# Patient Record
Sex: Male | Born: 1975 | Race: Black or African American | Hispanic: No | Marital: Single | Smoking: Current every day smoker
Health system: Southern US, Community
[De-identification: ages and names within clinical notes are randomized; demographics above are authoritative.]

## PROBLEM LIST (undated history)

## (undated) DIAGNOSIS — M419 Scoliosis, unspecified: Secondary | ICD-10-CM

## (undated) DIAGNOSIS — J45909 Unspecified asthma, uncomplicated: Secondary | ICD-10-CM

## (undated) DIAGNOSIS — G43909 Migraine, unspecified, not intractable, without status migrainosus: Secondary | ICD-10-CM

## (undated) DIAGNOSIS — J302 Other seasonal allergic rhinitis: Secondary | ICD-10-CM

## (undated) DIAGNOSIS — M4800 Spinal stenosis, site unspecified: Secondary | ICD-10-CM

## (undated) HISTORY — PX: COSMETIC SURGERY: SHX468

---

## 2008-10-22 ENCOUNTER — Emergency Department (HOSPITAL_COMMUNITY): Admission: EM | Admit: 2008-10-22 | Discharge: 2008-10-22 | Payer: Self-pay | Admitting: Family Medicine

## 2009-08-02 ENCOUNTER — Emergency Department (HOSPITAL_COMMUNITY): Admission: EM | Admit: 2009-08-02 | Discharge: 2009-08-02 | Payer: Self-pay | Admitting: Family Medicine

## 2013-05-04 ENCOUNTER — Emergency Department (HOSPITAL_COMMUNITY)
Admission: EM | Admit: 2013-05-04 | Discharge: 2013-05-04 | Disposition: A | Discharge status: Home / Self Care | Payer: Self-pay | Attending: Emergency Medicine | Admitting: Emergency Medicine

## 2013-05-04 ENCOUNTER — Encounter (HOSPITAL_COMMUNITY): Payer: Self-pay | Admitting: *Deleted

## 2013-05-04 DIAGNOSIS — J45909 Unspecified asthma, uncomplicated: Secondary | ICD-10-CM | POA: Insufficient documentation

## 2013-05-04 DIAGNOSIS — Z8709 Personal history of other diseases of the respiratory system: Secondary | ICD-10-CM | POA: Insufficient documentation

## 2013-05-04 DIAGNOSIS — Z79899 Other long term (current) drug therapy: Secondary | ICD-10-CM | POA: Insufficient documentation

## 2013-05-04 DIAGNOSIS — R519 Headache, unspecified: Secondary | ICD-10-CM

## 2013-05-04 DIAGNOSIS — H53149 Visual discomfort, unspecified: Secondary | ICD-10-CM | POA: Insufficient documentation

## 2013-05-04 DIAGNOSIS — R110 Nausea: Secondary | ICD-10-CM | POA: Insufficient documentation

## 2013-05-04 DIAGNOSIS — IMO0001 Reserved for inherently not codable concepts without codable children: Secondary | ICD-10-CM | POA: Insufficient documentation

## 2013-05-04 DIAGNOSIS — M549 Dorsalgia, unspecified: Secondary | ICD-10-CM | POA: Insufficient documentation

## 2013-05-04 DIAGNOSIS — R51 Headache: Secondary | ICD-10-CM | POA: Insufficient documentation

## 2013-05-04 DIAGNOSIS — F17200 Nicotine dependence, unspecified, uncomplicated: Secondary | ICD-10-CM | POA: Insufficient documentation

## 2013-05-04 DIAGNOSIS — Z87798 Personal history of other (corrected) congenital malformations: Secondary | ICD-10-CM | POA: Insufficient documentation

## 2013-05-04 HISTORY — DX: Spinal stenosis, site unspecified: M48.00

## 2013-05-04 HISTORY — DX: Scoliosis, unspecified: M41.9

## 2013-05-04 HISTORY — DX: Migraine, unspecified, not intractable, without status migrainosus: G43.909

## 2013-05-04 HISTORY — DX: Other seasonal allergic rhinitis: J30.2

## 2013-05-04 HISTORY — DX: Unspecified asthma, uncomplicated: J45.909

## 2013-05-04 LAB — BASIC METABOLIC PANEL
BUN: 12 mg/dL (ref 6–23)
GFR calc Af Amer: >90 mL/min (ref >90)
GFR calc non Af Amer: >90 mL/min (ref >90)
Potassium: 3.4 mEq/L — ABNORMAL LOW (ref 3.5–5.1)
Sodium: 138 mEq/L (ref 135–145)

## 2013-05-04 LAB — CBC WITH DIFFERENTIAL/PLATELET
Basophils Absolute: 0 10*3/uL (ref 0.0–0.1)
Basophils Relative: 0 % (ref 0–1)
Eosinophils Absolute: 0.1 10*3/uL (ref 0.0–0.7)
MCH: 29.7 pg (ref 26.0–34.0)
MCHC: 35.6 g/dL (ref 30.0–36.0)
Monocytes Relative: 8 % (ref 3–12)
Neutro Abs: 6.2 10*3/uL (ref 1.7–7.7)
Neutrophils Relative %: 68 % (ref 43–77)
Platelets: 203 10*3/uL (ref 150–400)
RBC: 4.88 MIL/uL (ref 4.22–5.81)
RDW: 13.4 % (ref 11.5–15.5)
WBC: 9.2 10*3/uL (ref 4.0–10.5)

## 2013-05-04 LAB — CG4 I-STAT (LACTIC ACID): Lactic Acid, Venous: 0.68 mmol/L (ref 0.5–2.2)

## 2013-05-04 MED ORDER — METOCLOPRAMIDE HCL 5 MG/ML IJ SOLN
10.0000 mg | Freq: Once | INTRAMUSCULAR | Status: AC
  Administered 2013-05-04: 10 mg via INTRAMUSCULAR
  Filled 2013-05-04: qty 2

## 2013-05-04 MED ORDER — HYDROMORPHONE HCL PF 1 MG/ML IJ SOLN: 1.0000 mg | Freq: Once | INTRAMUSCULAR | Status: DC

## 2013-05-04 MED ORDER — DIPHENHYDRAMINE HCL 50 MG/ML IJ SOLN
25.0000 mg | Freq: Once | INTRAMUSCULAR | Status: AC
  Administered 2013-05-04: 25 mg via INTRAMUSCULAR
  Filled 2013-05-04: qty 1

## 2013-05-04 MED ORDER — KETOROLAC TROMETHAMINE 30 MG/ML IJ SOLN
30.0000 mg | Freq: Once | INTRAMUSCULAR | Status: AC
  Administered 2013-05-04: 30 mg via INTRAMUSCULAR
  Filled 2013-05-04: qty 1

## 2013-05-04 MED ORDER — DIPHENHYDRAMINE HCL 50 MG/ML IJ SOLN
25.0000 mg | Freq: Once | INTRAMUSCULAR | Status: AC
  Administered 2013-05-04: 25 mg via INTRAVENOUS
  Filled 2013-05-04: qty 1

## 2013-05-04 MED ORDER — SODIUM CHLORIDE 0.9 % IV BOLUS (SEPSIS)
1000.0000 mL | Freq: Once | INTRAVENOUS | Status: AC
  Administered 2013-05-04: 1000 mL via INTRAVENOUS

## 2013-05-04 MED ORDER — DIPHENHYDRAMINE HCL 50 MG/ML IJ SOLN: 25.0000 mg | Freq: Once | INTRAMUSCULAR | Status: DC

## 2013-05-04 MED ORDER — HYDROMORPHONE HCL PF 1 MG/ML IJ SOLN
1.0000 mg | Freq: Once | INTRAMUSCULAR | Status: AC
  Administered 2013-05-04: 1 mg via INTRAMUSCULAR
  Filled 2013-05-04: qty 1

## 2013-05-04 NOTE — ED Notes (Signed)
Per EMS report: pt from home: pt c/o back pain secondary spinal stenosis.  Pt also c/o migraine that began 2 days.  Pt has chronic neck and back pain but pain got worse about 30 minutes prior to arrival to hospital. EMS VS: BP: 142/P, HR: 100R, RR: 20, 96% RA.  Rates pain 10/10 for both back and migraine.

## 2013-05-04 NOTE — ED Provider Notes (Signed)
History     CSN: [id number]  Arrival date & time 05/04/13  0021   First MD Initiated Contact with Patient 05/04/13 0116      Chief Complaint  Patient presents with  . Back Pain  . Migraine    (Consider location/radiation/quality/duration/timing/severity/associated sxs/prior treatment) HPI Comments: Right sided, intermittent, throbbing headache x3 days without thunderclap onset. No aggravating or alleviating factors. Patient took ibuprofen with only very mild relief. Patient with associated phonophobia, photophobia, and nausea without emesis. Patient denies fevers, vision changes, difficulty speaking or swallowing, extremity weakness and an inability to ambulate. Hx of migraines but states hasn't had one in a while because "chiropractor did something that helped" him many years ago.  Patient is a 37 y.o. male presenting with migraines. The history is provided by the patient. No language interpreter was used.  Migraine This is a new problem. The current episode started in the past 7 days. The problem occurs intermittently. Progression since onset: gradually worsened followed by slight improvement and reworsening. Associated symptoms include headaches, myalgias (neck and back) and nausea. Pertinent negatives include no abdominal pain, chest pain, fever, numbness, vomiting or weakness. Associated symptoms comments: Photophobia, phonophobia. Exacerbated by: light and certain movements. He has tried NSAIDs for the symptoms. The treatment provided mild relief.    Past Medical History  Diagnosis Date  . Asthma   . Migraine   . Spinal stenosis   . Scoliosis   . Seasonal allergies     Past Surgical History  Procedure Laterality Date  . Cosmetic surgery      No family history on file.  History  Substance Use Topics  . Smoking status: Current Every Day Smoker -- 1.00 packs/day  . Smokeless tobacco: Not on file  . Alcohol Use: Yes     Comment: occsional     Review of Systems   Constitutional: Negative for fever.  HENT: Negative for trouble swallowing.   Eyes: Positive for photophobia. Negative for visual disturbance.  Respiratory: Negative for shortness of breath.   Cardiovascular: Negative for chest pain.  Gastrointestinal: Positive for nausea. Negative for vomiting and abdominal pain.  Musculoskeletal: Positive for myalgias (neck and back) and back pain.  Neurological: Positive for headaches. Negative for syncope, speech difficulty, weakness and numbness.  All other systems reviewed and are negative.    Allergies  Sulfa antibiotics  Home Medications   Current Outpatient Rx  Name  Route  Sig  Dispense  Refill  . acetaminophen (TYLENOL) 500 MG tablet   Oral   Take 500-1,000 mg by mouth every 6 (six) hours as needed for pain.         Marland Kitchen ibuprofen (ADVIL,MOTRIN) 200 MG tablet   Oral   Take 600 mg by mouth every 8 (eight) hours as needed for pain.           BP 106/61  Pulse 90  Temp(Src) 99.8 F (37.7 C) (Oral)  Resp 18  Ht 5\' 11"  (1.803 m)  Wt 200 lb (90.719 kg)  BMI 27.91 kg/m2  SpO2 100%  Physical Exam  Nursing note and vitals reviewed. Constitutional: He is oriented to person, place, and time. He appears well-developed and well-nourished. No distress.  HENT:  Head: Normocephalic and atraumatic.  Right Ear: External ear normal.  Left Ear: External ear normal.  Mouth/Throat: Oropharynx is clear and moist. No oropharyngeal exudate.  Eyes: Conjunctivae and EOM are normal. Pupils are equal, round, and reactive to light. No scleral icterus.  Neck: Normal range of motion.  Neck supple.  Full ROM of neck with point tenderness of R paraspinal muscles. Negative Brudzinski's sign. No nuchal rigidity or other meningeal signs.  Cardiovascular: Normal rate, regular rhythm, normal heart sounds and intact distal pulses.   Pulmonary/Chest: Effort normal and breath sounds normal. No respiratory distress. He has no wheezes. He has no rales.   Abdominal: Soft. He exhibits no distension. There is no tenderness. There is no rebound and no guarding.  Musculoskeletal: Normal range of motion. He exhibits no edema.  TTP of upper thoracic midline and paraspinal muscles. No palpable deformities or step offs appreciated.  Lymphadenopathy:    He has no cervical adenopathy.  Neurological: He is alert and oriented to person, place, and time. He has normal reflexes. He displays normal reflexes. No cranial nerve deficit. He exhibits normal muscle tone. Coordination normal.  Cranial nerves III through XII grossly intact. Patient exhibits equal grip strength bilaterally with 5 out of 5 strength against resistance in his upper and lower extremities. DTRs normal and symmetric. Patient moves his extremities without ataxia. No sensory or motor deficits appreciated. Patient speaks in full goal oriented sentences.  Skin: Skin is warm and dry. No rash noted. He is not diaphoretic. No erythema.  Psychiatric: He has a normal mood and affect. His behavior is normal.    ED Course  Procedures (including critical care time)  Labs Reviewed  BASIC METABOLIC PANEL - Abnormal; Notable for the following:    Potassium 3.4 (*)    Glucose, Bld 101 (*)    All other components within normal limits  CBC WITH DIFFERENTIAL  CG4 I-STAT (LACTIC ACID)   No results found.   1. Headache     MDM  Patient with hx of migraines, scoliosis and spinal stenosis presents for intermittent R sided headache that is throbbing in nature without thunderclap onset x 3 days. Physical exam without significant findings and patient neurovascularly intact. Do not believe further work up with imaging is necessary given lack of focal neurologic deficits and +hx of migraines. Low suspicion for meningitis given lack of fever and nuchal rigidity and negative Brudzinski's sign on physical exam; do not believe LP currently warranted. Patient also endorses chronic nature of back and neck pain. Will  tx with IM toradol, reglan, and benadryl and reassess. Patient seen also by Dr. Norlene Campbell who is in agreement with this work up and management plan.  Patient denies improvement in symptoms with migraine cocktail. Continues to be well and nontoxic appearing. 1mg  IM dilaudid ordered. Given temp trending upward, will obtain CBC, BMP, and lactic acid for further evaluation of symptoms.  Labs without leukocytosis, anemia, or electrolyte imbalance. Kidney function preserved and lactic acid level within normal limits. Wife endorses improvement in patient's symptoms with Dilaudid. Patient sleeping soundly and snoring, in NAD or discomfort in hospital bed. Patient appropriate for d/c with PCP follow up. Ibuprofen recommended for symptoms as well as adequate rest and fluid hydration. Neurology referral given if symptoms persist. Spouse verbalizes comfort and understanding with plan with no unaddressed concerns.   Antony Madura, PA-C 05/04/13 [phone]

## 2013-05-04 NOTE — ED Provider Notes (Signed)
Medical screening examination/treatment/procedure(s) were conducted as a shared visit with non-physician practitioner(s) and myself.  I personally evaluated the patient during the encounter.  Pt with headache since Thursday.  Chills and feeling hot, but no fever.  HA right sided, +photo/phono phobia.  H/o migranes, also h/o meningitis, most likely viral several years ago.  No sick contacts, no rash, no confusion.  Exam shows normal neuro status, CNs tested and intact.  TTP over right occiput and paraspinal on right.  Normal rotational movement, normal extension, pain with flexion over paraspinal muscles.  D/w pt and wife regarding meningitis.  Suspect this is migraine, but tx of viral meningitis supportive as well.  Will get pain under control.  Given precautions for return.  Olivia Mackie, MD 05/04/13 [phone]

## 2013-05-04 NOTE — ED Notes (Signed)
CG4 I-stat Lactic acid result given to Dr. Norlene Campbell

## 2013-05-04 NOTE — ED Notes (Addendum)
Pt reports having chills and is warm to the touch. Pt's significant other concern about pt having possible meningitis d/t pt having similar sx as the last time pt had meningitis. Antony Madura, PA made aware.

## 2013-09-18 ENCOUNTER — Emergency Department (INDEPENDENT_AMBULATORY_CARE_PROVIDER_SITE_OTHER)
Admission: EM | Admit: 2013-09-18 | Discharge: 2013-09-18 | Disposition: A | Discharge status: Home / Self Care | Payer: Self-pay | Source: Home / Self Care | Attending: Family Medicine | Admitting: Family Medicine

## 2013-09-18 ENCOUNTER — Encounter (HOSPITAL_COMMUNITY): Payer: Self-pay | Admitting: Emergency Medicine

## 2013-09-18 DIAGNOSIS — K219 Gastro-esophageal reflux disease without esophagitis: Secondary | ICD-10-CM

## 2013-09-18 MED ORDER — GI COCKTAIL ~~LOC~~
30.0000 mL | Freq: Once | ORAL | Status: AC
  Administered 2013-09-18: 30 mL via ORAL

## 2013-09-18 MED ORDER — GI COCKTAIL ~~LOC~~
ORAL | Status: AC
  Filled 2013-09-18: qty 30

## 2013-09-18 MED ORDER — PANTOPRAZOLE SODIUM 40 MG PO TBEC: 40.0000 mg | DELAYED_RELEASE_TABLET | Freq: Every day | ORAL | Status: DC

## 2013-09-18 NOTE — ED Provider Notes (Signed)
CSN: [id number]     Arrival date & time 09/18/13  1438 History   First MD Initiated Contact with Patient 09/18/13 1626     Chief Complaint  Patient presents with  . Abdominal Pain  . Cyst   (Consider location/radiation/quality/duration/timing/severity/associated sxs/prior Treatment) Patient is a 37 y.o. male presenting with abdominal pain. The history is provided by the patient.  Abdominal Pain This is a new problem. The current episode started more than 1 week ago (h/o gerd, feels like stomach in knot.). The problem has been gradually worsening. Associated symptoms include abdominal pain.    Past Medical History  Diagnosis Date  . Asthma   . Migraine   . Spinal stenosis   . Scoliosis   . Seasonal allergies    Past Surgical History  Procedure Laterality Date  . Cosmetic surgery     History reviewed. No pertinent family history. History  Substance Use Topics  . Smoking status: Current Every Day Smoker -- 1.00 packs/day  . Smokeless tobacco: Not on file  . Alcohol Use: Yes     Comment: occsional    Review of Systems  Constitutional: Negative.   HENT: Negative.   Respiratory: Negative.   Cardiovascular: Negative.   Gastrointestinal: Positive for abdominal pain. Negative for nausea, vomiting and diarrhea.  Skin: Negative.     Allergies  Sulfa antibiotics  Home Medications   Current Outpatient Rx  Name  Route  Sig  Dispense  Refill  . acetaminophen (TYLENOL) 500 MG tablet   Oral   Take 500-1,000 mg by mouth every 6 (six) hours as needed for pain.         Marland Kitchen ibuprofen (ADVIL,MOTRIN) 200 MG tablet   Oral   Take 600 mg by mouth every 8 (eight) hours as needed for pain.         . pantoprazole (PROTONIX) 40 MG tablet   Oral   Take 1 tablet (40 mg total) by mouth daily.   30 tablet   1    BP 130/84  Pulse 69  Temp(Src) 98.1 F (36.7 C) (Oral)  Resp 18  SpO2 99% Physical Exam  Nursing note and vitals reviewed. Constitutional: He is oriented to person,  place, and time. He appears well-developed and well-nourished.  Pulmonary/Chest: Effort normal and breath sounds normal.  Abdominal: Soft. Bowel sounds are normal. He exhibits no distension and no mass. There is tenderness in the epigastric area. There is no rigidity, no rebound and no guarding.    Neurological: He is alert and oriented to person, place, and time.  Skin: Skin is warm and dry.    ED Course  Procedures (including critical care time) Labs Review Labs Reviewed - No data to display Imaging Review No results found.    MDM      Linna Hoff, MD 09/20/13 [phone]

## 2013-09-18 NOTE — ED Notes (Signed)
C/o cyst on head since June.  Patient states he went to the ER and was given keflex.  Followed directions and finished the keflex but the cyst has not went away.  Patient states that the cyst has popped on its own and returned.  C/o abd pain.  Denies diarrhea.  Feels as if stomach is in a knot.  Patient is constipated.  Lorie Apley oil was taken but no relief.  pepcid AC was taken also.

## 2015-02-08 ENCOUNTER — Inpatient Hospital Stay (HOSPITAL_COMMUNITY)
Admission: EM | Admit: 2015-02-08 | Discharge: 2015-02-12 | DRG: 481 | Disposition: A | Discharge status: Rehabilitation Facility | Payer: 59 | Attending: Orthopaedic Surgery | Admitting: Orthopaedic Surgery

## 2015-02-08 ENCOUNTER — Emergency Department (HOSPITAL_COMMUNITY): Payer: 59

## 2015-02-08 ENCOUNTER — Encounter (HOSPITAL_COMMUNITY): Admission: EM | Disposition: A | Discharge status: Rehabilitation Facility | Payer: Self-pay | Source: Home / Self Care

## 2015-02-08 ENCOUNTER — Inpatient Hospital Stay (HOSPITAL_COMMUNITY): Payer: 59

## 2015-02-08 ENCOUNTER — Other Ambulatory Visit (HOSPITAL_COMMUNITY): Payer: Self-pay

## 2015-02-08 ENCOUNTER — Inpatient Hospital Stay (HOSPITAL_COMMUNITY): Payer: 59 | Admitting: Certified Registered Nurse Anesthetist

## 2015-02-08 ENCOUNTER — Encounter (HOSPITAL_COMMUNITY): Payer: Self-pay | Admitting: Physical Medicine and Rehabilitation

## 2015-02-08 DIAGNOSIS — M4800 Spinal stenosis, site unspecified: Secondary | ICD-10-CM | POA: Diagnosis present

## 2015-02-08 DIAGNOSIS — S060X9A Concussion with loss of consciousness of unspecified duration, initial encounter: Secondary | ICD-10-CM | POA: Diagnosis present

## 2015-02-08 DIAGNOSIS — Z09 Encounter for follow-up examination after completed treatment for conditions other than malignant neoplasm: Secondary | ICD-10-CM

## 2015-02-08 DIAGNOSIS — Y92410 Unspecified street and highway as the place of occurrence of the external cause: Secondary | ICD-10-CM

## 2015-02-08 DIAGNOSIS — D62 Acute posthemorrhagic anemia: Secondary | ICD-10-CM | POA: Diagnosis not present

## 2015-02-08 DIAGNOSIS — S72302A Unspecified fracture of shaft of left femur, initial encounter for closed fracture: Secondary | ICD-10-CM

## 2015-02-08 DIAGNOSIS — S72322A Displaced transverse fracture of shaft of left femur, initial encounter for closed fracture: Secondary | ICD-10-CM | POA: Diagnosis present

## 2015-02-08 DIAGNOSIS — S7222XA Displaced subtrochanteric fracture of left femur, initial encounter for closed fracture: Secondary | ICD-10-CM | POA: Diagnosis not present

## 2015-02-08 DIAGNOSIS — S7222XS Displaced subtrochanteric fracture of left femur, sequela: Secondary | ICD-10-CM | POA: Diagnosis not present

## 2015-02-08 DIAGNOSIS — S72362F Displaced segmental fracture of shaft of left femur, subsequent encounter for open fracture type IIIA, IIIB, or IIIC with routine healing: Secondary | ICD-10-CM | POA: Diagnosis not present

## 2015-02-08 DIAGNOSIS — S060X1S Concussion with loss of consciousness of 30 minutes or less, sequela: Secondary | ICD-10-CM | POA: Diagnosis not present

## 2015-02-08 DIAGNOSIS — R61 Generalized hyperhidrosis: Secondary | ICD-10-CM | POA: Diagnosis not present

## 2015-02-08 DIAGNOSIS — Z882 Allergy status to sulfonamides status: Secondary | ICD-10-CM | POA: Diagnosis not present

## 2015-02-08 DIAGNOSIS — R40241 Glasgow coma scale score 13-15: Secondary | ICD-10-CM | POA: Diagnosis present

## 2015-02-08 DIAGNOSIS — T1490 Injury, unspecified: Secondary | ICD-10-CM | POA: Diagnosis not present

## 2015-02-08 DIAGNOSIS — Z791 Long term (current) use of non-steroidal anti-inflammatories (NSAID): Secondary | ICD-10-CM

## 2015-02-08 DIAGNOSIS — S81811A Laceration without foreign body, right lower leg, initial encounter: Secondary | ICD-10-CM | POA: Diagnosis present

## 2015-02-08 DIAGNOSIS — R339 Retention of urine, unspecified: Secondary | ICD-10-CM | POA: Diagnosis present

## 2015-02-08 DIAGNOSIS — T148XXA Other injury of unspecified body region, initial encounter: Secondary | ICD-10-CM

## 2015-02-08 DIAGNOSIS — M25539 Pain in unspecified wrist: Secondary | ICD-10-CM

## 2015-02-08 DIAGNOSIS — M25452 Effusion, left hip: Secondary | ICD-10-CM

## 2015-02-08 DIAGNOSIS — M419 Scoliosis, unspecified: Secondary | ICD-10-CM | POA: Diagnosis present

## 2015-02-08 DIAGNOSIS — S72362A Displaced segmental fracture of shaft of left femur, initial encounter for closed fracture: Secondary | ICD-10-CM | POA: Diagnosis present

## 2015-02-08 DIAGNOSIS — J45909 Unspecified asthma, uncomplicated: Secondary | ICD-10-CM | POA: Diagnosis present

## 2015-02-08 DIAGNOSIS — T1490XA Injury, unspecified, initial encounter: Secondary | ICD-10-CM

## 2015-02-08 DIAGNOSIS — F17210 Nicotine dependence, cigarettes, uncomplicated: Secondary | ICD-10-CM | POA: Diagnosis present

## 2015-02-08 DIAGNOSIS — R413 Other amnesia: Secondary | ICD-10-CM | POA: Diagnosis present

## 2015-02-08 DIAGNOSIS — S72362S Displaced segmental fracture of shaft of left femur, sequela: Secondary | ICD-10-CM | POA: Diagnosis not present

## 2015-02-08 DIAGNOSIS — S72362J Displaced segmental fracture of shaft of left femur, subsequent encounter for open fracture type IIIA, IIIB, or IIIC with delayed healing: Secondary | ICD-10-CM | POA: Diagnosis not present

## 2015-02-08 DIAGNOSIS — Z79899 Other long term (current) drug therapy: Secondary | ICD-10-CM

## 2015-02-08 DIAGNOSIS — S060XAA Concussion with loss of consciousness status unknown, initial encounter: Secondary | ICD-10-CM | POA: Diagnosis present

## 2015-02-08 HISTORY — PX: FEMUR IM NAIL: SHX1597

## 2015-02-08 HISTORY — PX: INCISION AND DRAINAGE OF WOUND: SHX1803

## 2015-02-08 HISTORY — PX: FEMUR FRACTURE SURGERY: SHX633

## 2015-02-08 LAB — CBC
HEMATOCRIT: 48.9 % (ref 39.0–52.0)
HEMATOCRIT: 35.6 % — AB (ref 39.0–52.0)
Hemoglobin: 15.7 g/dL (ref 13.0–17.0)
Hemoglobin: 12.4 g/dL — ABNORMAL LOW (ref 13.0–17.0)
MCH: 31.2 pg (ref 26.0–34.0)
MCH: 30.2 pg (ref 26.0–34.0)
MCHC: 32.1 g/dL (ref 30.0–36.0)
MCHC: 34.8 g/dL (ref 30.0–36.0)
MCV: 97 fL (ref 78.0–100.0)
MCV: 86.6 fL (ref 78.0–100.0)
Platelets: 197 10*3/uL (ref 150–400)
Platelets: 245 10*3/uL (ref 150–400)
RBC: 5.04 MIL/uL (ref 4.22–5.81)
RBC: 4.11 MIL/uL — AB (ref 4.22–5.81)
RDW: 13.5 % (ref 11.5–15.5)
RDW: 13.4 % (ref 11.5–15.5)
WBC: 12.3 10*3/uL — ABNORMAL HIGH (ref 4.0–10.5)
WBC: 14.4 10*3/uL — ABNORMAL HIGH (ref 4.0–10.5)

## 2015-02-08 LAB — CREATININE, SERUM
CREATININE: 0.79 mg/dL (ref 0.50–1.35)
GFR calc Af Amer: >90 mL/min (ref >90)
GFR calc non Af Amer: >90 mL/min (ref >90)

## 2015-02-08 LAB — I-STAT CHEM 8, ED
BUN: 13 mg/dL (ref 6–23)
Calcium, Ion: 1.11 mmol/L — ABNORMAL LOW (ref 1.12–1.23)
Chloride: 102 mmol/L (ref 96–112)
Creatinine, Ser: 1.6 mg/dL — ABNORMAL HIGH (ref 0.50–1.35)
Glucose, Bld: 121 mg/dL — ABNORMAL HIGH (ref 70–99)
HCT: 47 % (ref 39.0–52.0)
Hemoglobin: 16 g/dL (ref 13.0–17.0)
Potassium: 3.6 mmol/L (ref 3.5–5.1)
Sodium: 141 mmol/L (ref 135–145)
TCO2: 24 mmol/L (ref 0–100)

## 2015-02-08 LAB — PREPARE FRESH FROZEN PLASMA
Unit division: 0
Unit division: 0

## 2015-02-08 LAB — COMPREHENSIVE METABOLIC PANEL
ALK PHOS: 72 U/L (ref 39–117)
ALT: 27 U/L (ref 0–53)
AST: 35 U/L (ref 0–37)
Albumin: 4.6 g/dL (ref 3.5–5.2)
Anion gap: 10 (ref 5–15)
BUN: 10 mg/dL (ref 6–23)
CO2: 26 mmol/L (ref 19–32)
Calcium: 9.4 mg/dL (ref 8.4–10.5)
Chloride: 103 mmol/L (ref 96–112)
Creatinine, Ser: 1.28 mg/dL (ref 0.50–1.35)
GFR calc Af Amer: 80 mL/min — ABNORMAL LOW (ref >90)
GFR calc non Af Amer: 69 mL/min — ABNORMAL LOW (ref >90)
GLUCOSE: 131 mg/dL — AB (ref 70–99)
POTASSIUM: 3.4 mmol/L — AB (ref 3.5–5.1)
Sodium: 139 mmol/L (ref 135–145)
TOTAL PROTEIN: 6.7 g/dL (ref 6.0–8.3)
Total Bilirubin: 0.8 mg/dL (ref 0.3–1.2)

## 2015-02-08 LAB — ETHANOL: ALCOHOL ETHYL (B): 267 mg/dL — AB (ref 0–9)

## 2015-02-08 LAB — PROTIME-INR
INR: 0.95 (ref 0.00–1.49)
Prothrombin Time: 12.8 seconds (ref 11.6–15.2)

## 2015-02-08 LAB — ABO/RH: ABO/RH(D): O POS

## 2015-02-08 SURGERY — INSERTION, INTRAMEDULLARY ROD, FEMUR
Anesthesia: General | Site: Leg Upper | Laterality: Right

## 2015-02-08 MED ORDER — ONDANSETRON HCL 4 MG PO TABS: 4.0000 mg | ORAL_TABLET | Freq: Four times a day (QID) | ORAL | Status: DC | PRN

## 2015-02-08 MED ORDER — PROPOFOL 10 MG/ML IV BOLUS
INTRAVENOUS | Status: AC
  Filled 2015-02-08: qty 20

## 2015-02-08 MED ORDER — MENTHOL 3 MG MT LOZG: 1.0000 | LOZENGE | OROMUCOSAL | Status: DC | PRN

## 2015-02-08 MED ORDER — HYDROMORPHONE HCL 1 MG/ML IJ SOLN
1.0000 mg | INTRAMUSCULAR | Status: DC | PRN
  Administered 2015-02-08 – 2015-02-09 (×6): 1 mg via INTRAVENOUS
  Filled 2015-02-08 (×6): qty 1

## 2015-02-08 MED ORDER — SORBITOL 70 % SOLN: 30.0000 mL | Freq: Every day | Status: DC | PRN

## 2015-02-08 MED ORDER — OXYCODONE HCL 5 MG PO TABS
5.0000 mg | ORAL_TABLET | ORAL | Status: DC | PRN
  Administered 2015-02-08 – 2015-02-09 (×3): 15 mg via ORAL
  Filled 2015-02-08 (×3): qty 3

## 2015-02-08 MED ORDER — FENTANYL CITRATE 0.05 MG/ML IJ SOLN
INTRAMUSCULAR | Status: DC | PRN
  Administered 2015-02-08 (×2): 50 ug via INTRAVENOUS
  Administered 2015-02-08: 100 ug via INTRAVENOUS
  Administered 2015-02-08: 50 ug via INTRAVENOUS
  Administered 2015-02-08: 100 ug via INTRAVENOUS

## 2015-02-08 MED ORDER — SUCCINYLCHOLINE CHLORIDE 20 MG/ML IJ SOLN
INTRAMUSCULAR | Status: DC | PRN
  Administered 2015-02-08: 120 mg via INTRAVENOUS

## 2015-02-08 MED ORDER — NEOSTIGMINE METHYLSULFATE 10 MG/10ML IV SOLN
INTRAVENOUS | Status: AC
  Filled 2015-02-08: qty 1

## 2015-02-08 MED ORDER — CEFAZOLIN SODIUM-DEXTROSE 2-3 GM-% IV SOLR
INTRAVENOUS | Status: DC | PRN
  Administered 2015-02-08: 2 g via INTRAVENOUS

## 2015-02-08 MED ORDER — GLYCOPYRROLATE 0.2 MG/ML IJ SOLN
INTRAMUSCULAR | Status: AC
  Filled 2015-02-08: qty 4

## 2015-02-08 MED ORDER — CEFAZOLIN SODIUM-DEXTROSE 2-3 GM-% IV SOLR
2.0000 g | Freq: Four times a day (QID) | INTRAVENOUS | Status: DC
  Administered 2015-02-08 – 2015-02-09 (×2): 2 g via INTRAVENOUS
  Filled 2015-02-08 (×2): qty 50

## 2015-02-08 MED ORDER — FENTANYL CITRATE 0.05 MG/ML IJ SOLN
50.0000 ug | Freq: Once | INTRAMUSCULAR | Status: AC
  Administered 2015-02-08: 50 ug via INTRAVENOUS
  Filled 2015-02-08: qty 2

## 2015-02-08 MED ORDER — ARTIFICIAL TEARS OP OINT
TOPICAL_OINTMENT | OPHTHALMIC | Status: AC
  Filled 2015-02-08: qty 3.5

## 2015-02-08 MED ORDER — SUCCINYLCHOLINE CHLORIDE 20 MG/ML IJ SOLN
INTRAMUSCULAR | Status: AC
  Filled 2015-02-08: qty 1

## 2015-02-08 MED ORDER — HYDROMORPHONE HCL 1 MG/ML IJ SOLN
1.0000 mg | INTRAMUSCULAR | Status: DC | PRN
  Administered 2015-02-08: 1 mg via INTRAVENOUS
  Filled 2015-02-08: qty 1

## 2015-02-08 MED ORDER — POLYETHYLENE GLYCOL 3350 17 G PO PACK
17.0000 g | PACK | Freq: Every day | ORAL | Status: DC | PRN
  Administered 2015-02-09 – 2015-02-10 (×2): 17 g via ORAL
  Filled 2015-02-08 (×2): qty 1

## 2015-02-08 MED ORDER — FENTANYL CITRATE 0.05 MG/ML IJ SOLN
INTRAMUSCULAR | Status: AC
  Filled 2015-02-08: qty 5

## 2015-02-08 MED ORDER — FENTANYL CITRATE 0.05 MG/ML IJ SOLN
75.0000 ug | Freq: Once | INTRAMUSCULAR | Status: AC
  Administered 2015-02-08: 75 ug via INTRAVENOUS
  Filled 2015-02-08: qty 2

## 2015-02-08 MED ORDER — HYDROMORPHONE HCL 1 MG/ML IJ SOLN
INTRAMUSCULAR | Status: AC
  Filled 2015-02-08: qty 1

## 2015-02-08 MED ORDER — SODIUM CHLORIDE 0.9 % IV SOLN
INTRAVENOUS | Status: DC
  Administered 2015-02-08: 07:00:00 via INTRAVENOUS

## 2015-02-08 MED ORDER — PROMETHAZINE HCL 25 MG/ML IJ SOLN
6.2500 mg | INTRAMUSCULAR | Status: DC | PRN
Start: 2015-02-08 — End: 2015-02-08

## 2015-02-08 MED ORDER — HYDROCODONE-ACETAMINOPHEN 5-325 MG PO TABS
1.0000 | ORAL_TABLET | Freq: Four times a day (QID) | ORAL | Status: DC | PRN
  Administered 2015-02-08 – 2015-02-09 (×3): 2 via ORAL
  Filled 2015-02-08 (×3): qty 2

## 2015-02-08 MED ORDER — EPHEDRINE SULFATE 50 MG/ML IJ SOLN
INTRAMUSCULAR | Status: AC
  Filled 2015-02-08: qty 1

## 2015-02-08 MED ORDER — PROPOFOL 10 MG/ML IV BOLUS
INTRAVENOUS | Status: DC | PRN
  Administered 2015-02-08: 50 mg via INTRAVENOUS
  Administered 2015-02-08: 200 mg via INTRAVENOUS

## 2015-02-08 MED ORDER — IOHEXOL 300 MG/ML  SOLN
100.0000 mL | Freq: Once | INTRAMUSCULAR | Status: AC | PRN
  Administered 2015-02-08: 100 mL via INTRAVENOUS

## 2015-02-08 MED ORDER — METOCLOPRAMIDE HCL 5 MG/ML IJ SOLN: 5.0000 mg | Freq: Three times a day (TID) | INTRAMUSCULAR | Status: DC | PRN

## 2015-02-08 MED ORDER — METOCLOPRAMIDE HCL 10 MG PO TABS: 5.0000 mg | ORAL_TABLET | Freq: Three times a day (TID) | ORAL | Status: DC | PRN

## 2015-02-08 MED ORDER — LIDOCAINE HCL (CARDIAC) 20 MG/ML IV SOLN
INTRAVENOUS | Status: DC | PRN
  Administered 2015-02-08: 80 mg via INTRAVENOUS

## 2015-02-08 MED ORDER — SODIUM CHLORIDE 0.9 % IV BOLUS (SEPSIS)
2000.0000 mL | Freq: Once | INTRAVENOUS | Status: AC
  Administered 2015-02-08: 2000 mL via INTRAVENOUS

## 2015-02-08 MED ORDER — ONDANSETRON HCL 4 MG/2ML IJ SOLN: 4.0000 mg | Freq: Four times a day (QID) | INTRAMUSCULAR | Status: DC | PRN

## 2015-02-08 MED ORDER — ENOXAPARIN SODIUM 40 MG/0.4ML ~~LOC~~ SOLN
40.0000 mg | SUBCUTANEOUS | Status: DC
  Administered 2015-02-09 – 2015-02-12 (×4): 40 mg via SUBCUTANEOUS
  Filled 2015-02-08 (×4): qty 0.4

## 2015-02-08 MED ORDER — ALUM & MAG HYDROXIDE-SIMETH 200-200-20 MG/5ML PO SUSP
30.0000 mL | ORAL | Status: DC | PRN
  Administered 2015-02-09: 30 mL via ORAL
  Filled 2015-02-08: qty 30

## 2015-02-08 MED ORDER — CEFAZOLIN SODIUM 1-5 GM-% IV SOLN
1.0000 g | Freq: Three times a day (TID) | INTRAVENOUS | Status: DC
  Administered 2015-02-08 – 2015-02-12 (×13): 1 g via INTRAVENOUS
  Filled 2015-02-08 (×16): qty 50

## 2015-02-08 MED ORDER — LIDOCAINE HCL (CARDIAC) 20 MG/ML IV SOLN
INTRAVENOUS | Status: AC
  Filled 2015-02-08: qty 10

## 2015-02-08 MED ORDER — MIDAZOLAM HCL 2 MG/2ML IJ SOLN
INTRAMUSCULAR | Status: AC
  Filled 2015-02-08: qty 2

## 2015-02-08 MED ORDER — MAGNESIUM CITRATE PO SOLN: 1.0000 | Freq: Once | ORAL | Status: AC | PRN

## 2015-02-08 MED ORDER — FENTANYL CITRATE 0.05 MG/ML IJ SOLN
100.0000 ug | Freq: Once | INTRAMUSCULAR | Status: AC
  Administered 2015-02-08: 100 ug via INTRAVENOUS

## 2015-02-08 MED ORDER — ROCURONIUM BROMIDE 50 MG/5ML IV SOLN
INTRAVENOUS | Status: AC
  Filled 2015-02-08: qty 1

## 2015-02-08 MED ORDER — LACTATED RINGERS IV SOLN
INTRAVENOUS | Status: DC | PRN
  Administered 2015-02-08 (×2): via INTRAVENOUS

## 2015-02-08 MED ORDER — ONDANSETRON HCL 4 MG/2ML IJ SOLN
INTRAMUSCULAR | Status: DC | PRN
  Administered 2015-02-08: 4 mg via INTRAVENOUS

## 2015-02-08 MED ORDER — METHOCARBAMOL 1000 MG/10ML IJ SOLN
500.0000 mg | Freq: Four times a day (QID) | INTRAVENOUS | Status: DC | PRN
  Filled 2015-02-08: qty 5

## 2015-02-08 MED ORDER — METHOCARBAMOL 500 MG PO TABS
500.0000 mg | ORAL_TABLET | Freq: Four times a day (QID) | ORAL | Status: DC | PRN
  Administered 2015-02-09 – 2015-02-12 (×6): 500 mg via ORAL
  Filled 2015-02-08 (×6): qty 1

## 2015-02-08 MED ORDER — STERILE WATER FOR INJECTION IJ SOLN
INTRAMUSCULAR | Status: AC
  Filled 2015-02-08: qty 10

## 2015-02-08 MED ORDER — ACETAMINOPHEN 650 MG RE SUPP: 650.0000 mg | Freq: Four times a day (QID) | RECTAL | Status: DC | PRN

## 2015-02-08 MED ORDER — PHENOL 1.4 % MT LIQD: 1.0000 | OROMUCOSAL | Status: DC | PRN

## 2015-02-08 MED ORDER — SODIUM CHLORIDE 0.9 % IV SOLN: INTRAVENOUS | Status: DC

## 2015-02-08 MED ORDER — ACETAMINOPHEN 325 MG PO TABS: 650.0000 mg | ORAL_TABLET | Freq: Four times a day (QID) | ORAL | Status: DC | PRN

## 2015-02-08 MED ORDER — MORPHINE SULFATE 2 MG/ML IJ SOLN
0.5000 mg | INTRAMUSCULAR | Status: DC | PRN
  Administered 2015-02-09: 0.5 mg via INTRAVENOUS
  Filled 2015-02-08: qty 1

## 2015-02-08 MED ORDER — 0.9 % SODIUM CHLORIDE (POUR BTL) OPTIME
TOPICAL | Status: DC | PRN
  Administered 2015-02-08: 1000 mL
  Administered 2015-02-08: 3000 mL

## 2015-02-08 MED ORDER — HYDROMORPHONE HCL 1 MG/ML IJ SOLN
0.2500 mg | INTRAMUSCULAR | Status: DC | PRN
  Administered 2015-02-08 (×2): 0.5 mg via INTRAVENOUS

## 2015-02-08 MED ORDER — NEOSTIGMINE METHYLSULFATE 10 MG/10ML IV SOLN
INTRAVENOUS | Status: DC | PRN
  Administered 2015-02-08: 5 mg via INTRAVENOUS

## 2015-02-08 MED ORDER — GLYCOPYRROLATE 0.2 MG/ML IJ SOLN
INTRAMUSCULAR | Status: DC | PRN
  Administered 2015-02-08: .8 mg via INTRAVENOUS

## 2015-02-08 MED ORDER — ROCURONIUM BROMIDE 100 MG/10ML IV SOLN
INTRAVENOUS | Status: DC | PRN
  Administered 2015-02-08: 10 mg via INTRAVENOUS
  Administered 2015-02-08: 30 mg via INTRAVENOUS
  Administered 2015-02-08: 10 mg via INTRAVENOUS

## 2015-02-08 MED ORDER — SODIUM CHLORIDE 0.9 % IV SOLN
INTRAVENOUS | Status: DC
  Administered 2015-02-08: 17:00:00 via INTRAVENOUS

## 2015-02-08 MED ORDER — ONDANSETRON HCL 4 MG/2ML IJ SOLN
INTRAMUSCULAR | Status: AC
  Filled 2015-02-08: qty 2

## 2015-02-08 MED ORDER — FENTANYL CITRATE 0.05 MG/ML IJ SOLN
INTRAMUSCULAR | Status: AC
  Filled 2015-02-08: qty 2

## 2015-02-08 MED ORDER — PANTOPRAZOLE SODIUM 40 MG PO TBEC: 40.0000 mg | DELAYED_RELEASE_TABLET | Freq: Every day | ORAL | Status: DC

## 2015-02-08 SURGICAL SUPPLY — 56 items
BAG ISOLATION DRAPE 18X18 (DRAPES) ×4 IMPLANT
BIT DRILL 4.0 LONG AO (DRILL) ×6 IMPLANT
BIT DRILL SHORT 4.0 (BIT) ×4 IMPLANT
BNDG COHESIVE 4X5 TAN STRL (GAUZE/BANDAGES/DRESSINGS) ×3 IMPLANT
COVER PERINEAL POST (MISCELLANEOUS) ×3 IMPLANT
COVER SURGICAL LIGHT HANDLE (MISCELLANEOUS) ×3 IMPLANT
DRAPE C-ARM 42X72 X-RAY (DRAPES) ×3 IMPLANT
DRAPE C-ARMOR (DRAPES) ×3 IMPLANT
DRAPE INCISE IOBAN 66X45 STRL (DRAPES) ×3 IMPLANT
DRAPE ISOLATION BAG 18X18 (DRAPES) ×2
DRAPE ORTHO SPLIT 77X108 STRL (DRAPES) ×2
DRAPE PROXIMA HALF (DRAPES) ×6 IMPLANT
DRAPE SURG ORHT 6 SPLT 77X108 (DRAPES) ×4 IMPLANT
DRAPE U-SHAPE 47X51 STRL (DRAPES) ×3 IMPLANT
DRILL BIT SHORT 4.0 (BIT) ×2
DRILL STEP  6.4 (BIT) ×3 IMPLANT
DRSG EMULSION OIL 3X3 NADH (GAUZE/BANDAGES/DRESSINGS) ×3 IMPLANT
DRSG MEPILEX BORDER 4X4 (GAUZE/BANDAGES/DRESSINGS) ×9 IMPLANT
DRSG MEPILEX BORDER 4X8 (GAUZE/BANDAGES/DRESSINGS) ×3 IMPLANT
DURAPREP 26ML APPLICATOR (WOUND CARE) ×3 IMPLANT
ELECT CAUTERY BLADE 6.4 (BLADE) ×3 IMPLANT
ELECT REM PT RETURN 9FT ADLT (ELECTROSURGICAL) ×3
ELECTRODE REM PT RTRN 9FT ADLT (ELECTROSURGICAL) ×2 IMPLANT
FACESHIELD WRAPAROUND (MASK) ×6 IMPLANT
GLOVE BIO SURGEON STRL SZ7.5 (GLOVE) ×3 IMPLANT
GLOVE NEODERM STRL 7.5 LF PF (GLOVE) ×12 IMPLANT
GLOVE SURG NEODERM 7.5  LF PF (GLOVE) ×6
GLOVE SURG SYN 7.5  E (GLOVE) ×4
GLOVE SURG SYN 7.5 E (GLOVE) ×8 IMPLANT
GOWN STRL REIN XL XLG (GOWN DISPOSABLE) ×12 IMPLANT
GUIDE PIN 3.2MM (MISCELLANEOUS) ×2
GUIDE PIN ORTH 343X3.2XBRAD (MISCELLANEOUS) ×4 IMPLANT
GUIDE ROD 3.0 (MISCELLANEOUS) ×3
KIT BASIN OR (CUSTOM PROCEDURE TRAY) ×3 IMPLANT
KIT ROOM TURNOVER OR (KITS) ×3 IMPLANT
LINER BOOT UNIVERSAL DISP (MISCELLANEOUS) ×3 IMPLANT
MANIFOLD NEPTUNE II (INSTRUMENTS) ×3 IMPLANT
NS IRRIG 1000ML POUR BTL (IV SOLUTION) ×3 IMPLANT
PACK GENERAL/GYN (CUSTOM PROCEDURE TRAY) ×3 IMPLANT
PAD ARMBOARD 7.5X6 YLW CONV (MISCELLANEOUS) ×6 IMPLANT
ROD GUIDE 3.0 (MISCELLANEOUS) ×2 IMPLANT
SCREW 6.4X100 (Screw) ×3 IMPLANT
SCREW LOCKING 5.0X80 (Screw) ×3 IMPLANT
SCREW TRIGEN LOW PROF 5.0X37.5 (Screw) ×3 IMPLANT
SCREW TRIGEN LOW PROF 5.0X42.5 (Screw) ×3 IMPLANT
STAPLER VISISTAT 35W (STAPLE) IMPLANT
STOCKINETTE IMPERVIOUS 9X36 MD (GAUZE/BANDAGES/DRESSINGS) ×3 IMPLANT
SUT ETHILON 2 0 PSLX (SUTURE) ×3 IMPLANT
SUT MON AB 2-0 CT1 36 (SUTURE) ×3 IMPLANT
SUT VIC AB 0 CT1 27 (SUTURE) ×1
SUT VIC AB 0 CT1 27XBRD ANBCTR (SUTURE) ×2 IMPLANT
SUT VIC AB 2-0 CT1 27 (SUTURE) ×2
SUT VIC AB 2-0 CT1 TAPERPNT 27 (SUTURE) ×4 IMPLANT
TRAY FOLEY CATH 16FRSI W/METER (SET/KITS/TRAYS/PACK) ×3 IMPLANT
TROCHNAIL ANTIGRADE 130 10X42 (Orthopedic Implant) ×3 IMPLANT
WATER STERILE IRR 1000ML POUR (IV SOLUTION) ×6 IMPLANT
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
## 2015-02-08 NOTE — ED Notes (Signed)
X-ray at bedside

## 2015-02-08 NOTE — ED Notes (Signed)
Pt taken off nonrebreather and placed back on [place] 2 L. Maintaining sats. Will continue to monitor.
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
## 2015-02-08 NOTE — ED Notes (Signed)
Valuables labeled and sent to security: cigarettes, lighter, black wallet with $35.65, (2) [employer] red debit cards, (2) paypal cards, drivers license, (2) yellow keys (1) [employer] key and blue bluetooth headset. Clothing: jeans, black calvin klein belt, ralph lauren polo shirt, and socks labeled and placed in belongings bag.
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
## 2015-02-08 NOTE — ED Notes (Signed)
Pt transported to CT with RN

## 2015-02-08 NOTE — ED Notes (Signed)
Multiple attempts to reach Gregory Ibarra regarding ankle monitor by [employer] unsuccessful.

## 2015-02-08 NOTE — ED Notes (Signed)
Abrasion noted to R eyebrow region. Puncture wound noted to R lower leg, bleeding controlled upon arrival to ED.
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
## 2015-02-08 NOTE — Anesthesia Procedure Notes (Signed)
Procedure Name: Intubation Date/Time: 02/08/2015 9:47 AM Performed by: [place] Pink Pre-anesthesia Checklist: Patient identified, Emergency Drugs available, Suction available, Patient being monitored and Timeout performed Patient Re-evaluated:Patient Re-evaluated prior to inductionOxygen Delivery Method: Circle system utilized Preoxygenation: Pre-oxygenation with 100% oxygen Intubation Type: IV induction and Rapid sequence Laryngoscope Size: Glidescope (large adult) Grade View: Grade I Tube type: Oral Tube size: 7.5 mm Number of attempts: 1 Airway Equipment and Method: Stylet and Video-laryngoscopy Placement Confirmation: ETT inserted through vocal cords under direct vision,  positive ETCO2 and breath sounds checked- equal and bilateral Secured at: 23 cm Tube secured with: Tape Dental Injury: Teeth and Oropharynx as per pre-operative assessment  Comments: Neck maintained in neutral position
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
## 2015-02-08 NOTE — ED Notes (Signed)
Pt returned to exam room.

## 2015-02-08 NOTE — Anesthesia Postprocedure Evaluation (Signed)
  Anesthesia Post-op Note  Patient: Gregory Ibarra  Procedure(s) Performed: Procedure(s): INTRAMEDULLARY (IM) NAIL FEMORAL (Left) IRRIGATION AND DEBRIDEMENT OF RIGHT LOWER LEG WOUND (Right)  Patient Location: PACU  Anesthesia Type:General  Level of Consciousness: awake  Airway and Oxygen Therapy: Patient Spontanous Breathing  Post-op Pain: mild  Post-op Assessment: Post-op Vital signs reviewed  Post-op Vital Signs: Reviewed  Last Vitals:  Filed Vitals:   02/08/15 1246  BP: 156/98  Pulse: 71  Temp:   Resp: 14    Complications: No apparent anesthesia complications
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
## 2015-02-08 NOTE — Anesthesia Preprocedure Evaluation (Signed)
Anesthesia Evaluation  Patient identified by MRN, date of birth, ID band Patient awake    Reviewed: Allergy & Precautions, NPO status , Patient's Chart, lab work & pertinent test results  Airway Mallampati: II   Neck ROM: Full    Dental   Pulmonary asthma , Current Smoker,  breath sounds clear to auscultation        Cardiovascular negative cardio ROS  Rhythm:Regular Rate:Normal     Neuro/Psych    GI/Hepatic negative GI ROS, Neg liver ROS,   Endo/Other  negative endocrine ROS  Renal/GU negative Renal ROS     Musculoskeletal   Abdominal   Peds  Hematology   Anesthesia Other Findings   Reproductive/Obstetrics                             Anesthesia Physical Anesthesia Plan  ASA: II  Anesthesia Plan: General   Post-op Pain Management:    Induction: Intravenous  Airway Management Planned: Oral ETT  Additional Equipment:   Intra-op Plan:   Post-operative Plan: Extubation in OR  Informed Consent: I have reviewed the patients History and Physical, chart, labs and discussed the procedure including the risks, benefits and alternatives for the proposed anesthesia with the patient or authorized representative who has indicated his/her understanding and acceptance.   Dental advisory given  Plan Discussed with: CRNA and Anesthesiologist  Anesthesia Plan Comments:         Anesthesia Quick Evaluation

## 2015-02-08 NOTE — Transfer of Care (Signed)
Immediate Anesthesia Transfer of Care Note  Patient: Gregory Ibarra  Procedure(s) Performed: Procedure(s): INTRAMEDULLARY (IM) NAIL FEMORAL (Left) IRRIGATION AND DEBRIDEMENT OF RIGHT LOWER LEG WOUND (Right)  Patient Location: PACU  Anesthesia Type:General  Level of Consciousness: sedated and responds to stimulation  Airway & Oxygen Therapy: Patient Spontanous Breathing and Patient connected to face mask oxygen  Post-op Assessment: Report given to RN and Post -op Vital signs reviewed and stable  Post vital signs: Reviewed and stable  Last Vitals:  Filed Vitals:   02/08/15 0845  BP: 133/77  Pulse: 91  Temp:   Resp: 14    Complications: No apparent anesthesia complications

## 2015-02-09 ENCOUNTER — Encounter (HOSPITAL_COMMUNITY): Payer: Self-pay | Admitting: General Practice

## 2015-02-09 DIAGNOSIS — D62 Acute posthemorrhagic anemia: Secondary | ICD-10-CM | POA: Diagnosis not present

## 2015-02-09 DIAGNOSIS — S060XAA Concussion with loss of consciousness status unknown, initial encounter: Secondary | ICD-10-CM | POA: Diagnosis present

## 2015-02-09 DIAGNOSIS — S060X1S Concussion with loss of consciousness of 30 minutes or less, sequela: Secondary | ICD-10-CM

## 2015-02-09 DIAGNOSIS — S7222XS Displaced subtrochanteric fracture of left femur, sequela: Secondary | ICD-10-CM

## 2015-02-09 DIAGNOSIS — S72362S Displaced segmental fracture of shaft of left femur, sequela: Secondary | ICD-10-CM

## 2015-02-09 DIAGNOSIS — S060X9A Concussion with loss of consciousness of unspecified duration, initial encounter: Secondary | ICD-10-CM | POA: Diagnosis present

## 2015-02-09 DIAGNOSIS — S81811A Laceration without foreign body, right lower leg, initial encounter: Secondary | ICD-10-CM | POA: Diagnosis present

## 2015-02-09 LAB — CBC
HCT: 29 % — ABNORMAL LOW (ref 39.0–52.0)
Hemoglobin: 10.1 g/dL — ABNORMAL LOW (ref 13.0–17.0)
MCH: 30.1 pg (ref 26.0–34.0)
MCHC: 34.8 g/dL (ref 30.0–36.0)
MCV: 86.6 fL (ref 78.0–100.0)
Platelets: 228 10*3/uL (ref 150–400)
RBC: 3.35 MIL/uL — ABNORMAL LOW (ref 4.22–5.81)
RDW: 13.2 % (ref 11.5–15.5)
WBC: 10.9 10*3/uL — ABNORMAL HIGH (ref 4.0–10.5)

## 2015-02-09 LAB — BASIC METABOLIC PANEL
Anion gap: 6 (ref 5–15)
BUN: 9 mg/dL (ref 6–23)
CO2: 27 mmol/L (ref 19–32)
Calcium: 8.2 mg/dL — ABNORMAL LOW (ref 8.4–10.5)
Chloride: 103 mmol/L (ref 96–112)
Creatinine, Ser: 1.24 mg/dL (ref 0.50–1.35)
GFR calc Af Amer: 83 mL/min — ABNORMAL LOW (ref >90)
GFR calc non Af Amer: 72 mL/min — ABNORMAL LOW (ref >90)
Glucose, Bld: 120 mg/dL — ABNORMAL HIGH (ref 70–99)
Potassium: 4.6 mmol/L (ref 3.5–5.1)
Sodium: 136 mmol/L (ref 135–145)

## 2015-02-09 LAB — BLOOD PRODUCT ORDER (VERBAL) VERIFICATION

## 2015-02-09 MED ORDER — OXYCODONE HCL 5 MG PO TABS
10.0000 mg | ORAL_TABLET | ORAL | Status: DC | PRN
  Administered 2015-02-09: 10 mg via ORAL
  Administered 2015-02-09 – 2015-02-10 (×2): 20 mg via ORAL
  Administered 2015-02-10: 10 mg via ORAL
  Administered 2015-02-10: 20 mg via ORAL
  Administered 2015-02-11 – 2015-02-12 (×3): 10 mg via ORAL
  Filled 2015-02-09: qty 4
  Filled 2015-02-09 (×3): qty 2
  Filled 2015-02-09: qty 4
  Filled 2015-02-09 (×2): qty 2
  Filled 2015-02-09: qty 4

## 2015-02-09 MED ORDER — PANTOPRAZOLE SODIUM 40 MG PO TBEC
40.0000 mg | DELAYED_RELEASE_TABLET | Freq: Every day | ORAL | Status: DC
  Administered 2015-02-09 – 2015-02-12 (×4): 40 mg via ORAL
  Filled 2015-02-09 (×4): qty 1

## 2015-02-09 MED ORDER — HYDROMORPHONE HCL 1 MG/ML IJ SOLN
0.5000 mg | INTRAMUSCULAR | Status: DC | PRN
  Administered 2015-02-09 – 2015-02-12 (×6): 0.5 mg via INTRAVENOUS
  Filled 2015-02-09 (×6): qty 1

## 2015-02-09 MED ORDER — TRAMADOL HCL 50 MG PO TABS
100.0000 mg | ORAL_TABLET | Freq: Four times a day (QID) | ORAL | Status: DC
  Administered 2015-02-09 – 2015-02-12 (×11): 100 mg via ORAL
  Filled 2015-02-09 (×13): qty 2

## 2015-02-09 MED ORDER — DOCUSATE SODIUM 100 MG PO CAPS
100.0000 mg | ORAL_CAPSULE | Freq: Two times a day (BID) | ORAL | Status: DC
  Administered 2015-02-09 – 2015-02-12 (×7): 100 mg via ORAL
  Filled 2015-02-09 (×6): qty 1

## 2015-02-09 MED ORDER — BACITRACIN ZINC 500 UNIT/GM EX OINT
TOPICAL_OINTMENT | Freq: Two times a day (BID) | CUTANEOUS | Status: DC
  Administered 2015-02-09: 10:00:00 via TOPICAL
  Administered 2015-02-09: 1 via TOPICAL
  Administered 2015-02-10: 10:00:00 via TOPICAL
  Administered 2015-02-10 – 2015-02-11 (×2): 15.5556 via TOPICAL
  Administered 2015-02-11: 21:00:00 via TOPICAL
  Administered 2015-02-12: 15.5556 via TOPICAL
  Filled 2015-02-09: qty 28.35

## 2015-02-09 NOTE — Progress Notes (Signed)
Pt has been sinus tachy range from 150-170 several times this morning. PA Silvestre Gunner notified, is going to get 12 EKG stat for pt. Will continue to monitor pt closely
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
## 2015-02-09 NOTE — Progress Notes (Signed)
Rehab Admissions Coordinator Note:  Patient was screened by Darrick Greenlaw L for appropriateness for an Inpatient Acute Rehab Consult.  At this time, we are recommending Inpatient Rehab consult.  Gregory Ibarra L 02/09/2015, 2:27 PM  I can be reached at [phone].
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
## 2015-02-09 NOTE — Progress Notes (Signed)
   Subjective:  Patient reports pain as moderate.  No events.  Objective:   VITALS:   Filed Vitals:   02/08/15 1446 02/08/15 1545 02/08/15 2028 02/09/15 0503  BP: 138/81 150/78 138/86 137/94  Pulse: 85 82 90 107  Temp: 98 F (36.7 C) 98.5 F (36.9 C) 98.7 F (37.1 C) 98.1 F (36.7 C)  TempSrc:  Oral Oral Oral  Resp: 13     Height:      Weight:      SpO2: 100%  99% 99%    Neurologically intact Neurovascular intact Sensation intact distally Intact pulses distally Dorsiflexion/Plantar flexion intact Incision: dressing C/D/I and no drainage No cellulitis present Compartment soft   Lab Results  Component Value Date   WBC 14.4* 02/08/2015   HGB 12.4* 02/08/2015   HCT 35.6* 02/08/2015   MCV 86.6 02/08/2015   PLT 245 02/08/2015     Assessment/Plan:  1 Day Post-Op   - Expected postop acute blood loss anemia - will monitor for symptoms - Up with PT/OT - DVT ppx - SCDs, ambulation, lovenox - WBAT bilateral lower extremity - Pain control - will look at incisions tomorrow  Marianna Payment 02/09/2015, 7:52 AM [phone]
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
## 2015-02-09 NOTE — Progress Notes (Signed)
OT Cancellation Note  Patient Details Name: Obdulio Mash MRN: [id number] DOB: 1976-01-10   Cancelled Treatment:    Reason Eval/Treat Not Completed: Other (comment) Per nursing, pt received pain meds but still reporting increased pain. Nursing requests OT to hold off on OOB until she can give additional pain meds. Will try back later time.  Jules Schick  [phone] 02/09/2015, 12:17 PM

## 2015-02-10 ENCOUNTER — Inpatient Hospital Stay (HOSPITAL_COMMUNITY): Payer: 59

## 2015-02-10 ENCOUNTER — Encounter (HOSPITAL_COMMUNITY): Payer: Self-pay | Admitting: Orthopaedic Surgery

## 2015-02-10 LAB — CBC
HEMATOCRIT: 19.5 % — AB (ref 39.0–52.0)
HEMATOCRIT: 18.2 % — AB (ref 39.0–52.0)
HEMOGLOBIN: 7 g/dL — AB (ref 13.0–17.0)
HEMOGLOBIN: 6.5 g/dL — AB (ref 13.0–17.0)
MCH: 30.4 pg (ref 26.0–34.0)
MCH: 30.1 pg (ref 26.0–34.0)
MCHC: 35.9 g/dL (ref 30.0–36.0)
MCHC: 35.7 g/dL (ref 30.0–36.0)
MCV: 84.8 fL (ref 78.0–100.0)
MCV: 84.3 fL (ref 78.0–100.0)
Platelets: 159 10*3/uL (ref 150–400)
Platelets: 149 10*3/uL — ABNORMAL LOW (ref 150–400)
RBC: 2.3 MIL/uL — ABNORMAL LOW (ref 4.22–5.81)
RBC: 2.16 MIL/uL — AB (ref 4.22–5.81)
RDW: 13 % (ref 11.5–15.5)
RDW: 12.9 % (ref 11.5–15.5)
WBC: 8.3 10*3/uL (ref 4.0–10.5)
WBC: 7.8 10*3/uL (ref 4.0–10.5)

## 2015-02-10 LAB — PREPARE RBC (CROSSMATCH)

## 2015-02-10 MED ORDER — SODIUM CHLORIDE 0.9 % IV SOLN
Freq: Once | INTRAVENOUS | Status: AC
  Administered 2015-02-10: 22:00:00 via INTRAVENOUS

## 2015-02-10 MED ORDER — MORPHINE SULFATE ER 15 MG PO TBCR
15.0000 mg | EXTENDED_RELEASE_TABLET | Freq: Two times a day (BID) | ORAL | Status: DC
Start: 2015-02-10 — End: 2015-02-12
  Administered 2015-02-10 – 2015-02-12 (×5): 15 mg via ORAL
  Filled 2015-02-10 (×5): qty 1

## 2015-02-10 MED ORDER — BETHANECHOL CHLORIDE 25 MG PO TABS
25.0000 mg | ORAL_TABLET | Freq: Four times a day (QID) | ORAL | Status: DC
  Administered 2015-02-10 – 2015-02-12 (×9): 25 mg via ORAL
  Filled 2015-02-10 (×10): qty 1

## 2015-02-10 MED ORDER — TAMSULOSIN HCL 0.4 MG PO CAPS
0.8000 mg | ORAL_CAPSULE | Freq: Every day | ORAL | Status: DC
  Administered 2015-02-10 – 2015-02-12 (×3): 0.8 mg via ORAL
  Filled 2015-02-10 (×4): qty 2

## 2015-02-10 MED ORDER — SODIUM CHLORIDE 0.9 % IV SOLN: Freq: Once | INTRAVENOUS | Status: AC

## 2015-02-10 MED ORDER — METOPROLOL TARTRATE 50 MG PO TABS
50.0000 mg | ORAL_TABLET | Freq: Two times a day (BID) | ORAL | Status: DC
  Administered 2015-02-10 (×2): 50 mg via ORAL
  Filled 2015-02-10 (×2): qty 1
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
## 2015-02-10 NOTE — Progress Notes (Signed)
UR completed.  Hamda Klutts, RN BSN [facility] CCM Trauma/Neuro ICU Case Manager [phone]  

## 2015-02-10 NOTE — Progress Notes (Signed)
Inpatient Rehabilitation  Pt's blood has not yet been drawn.  I will not plan to admit today.  I will follow up tomorrow.  Please call if questions.  [facility] Admissions Coordinator Cell [phone] Office [phone]
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
## 2015-02-10 NOTE — Progress Notes (Signed)
Pt attempted to void multiple times, but was unsuccessful. Bladder scan at 10:30 pm showed >200 ccs. Dr. Brantley Stage notified of urinary retention and resting heart rate in 110s-120s, and 150-170s during transfers. Received order to reinsert foley. 900 ccs amber urine returned. Will continue to monitor.   Ethelda Chick G 02/10/2015 1:21 AM

## 2015-02-10 NOTE — Progress Notes (Addendum)
   Subjective:  Patient doing well.  Objective:   VITALS:   Filed Vitals:   02/08/15 2028 02/09/15 0503 02/09/15 1402 02/10/15 0540  BP: 138/86 137/94 147/91 147/76  Pulse: 90 107 103 105  Temp: 98.7 F (37.1 C) 98.1 F (36.7 C) 98.9 F (37.2 C) 98.6 F (37 C)  TempSrc: Oral Oral Oral Oral  Resp:      Height:      Weight:      SpO2: 99% 99% 100% 100%    LLE dressings c/d/i RLE incision c/d/i  Lab Results  Component Value Date   WBC 10.9* 02/09/2015   HGB 10.1* 02/09/2015   HCT 29.0* 02/09/2015   MCV 86.6 02/09/2015   PLT 228 02/09/2015     Assessment/Plan:  2 Days Post-Op   - recommend 2 weeks for lovenox from surgery - continue PT - stable from ortho standpoint - patient had runs of tachy yesterday - CIR vs HHPT - RLE dressing changed  Marianna Payment 02/10/2015, 7:36 AM [phone]
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
## 2015-02-10 NOTE — Progress Notes (Signed)
Inpatient Rehabilitation  I met at the bedside with the patient to discuss his post acute rehab needs and options.  He is interested in coming to [facility].    I have discussed pt's elevated HR and low hgb with Silvestre Gunner, trauma PA.  He plans to repeat pt.'s  hgb stat then make a determination of course of treatment.    I will follow up later this afternoon to determine if pt. Will be ready for IP rehab today.  Please call if questions.   [place] Admissions Coordinator Cell [phone] Office [phone]

## 2015-02-10 NOTE — Progress Notes (Signed)
SBIRT completed with pt.  Although his results indicate that he is at high-risk/harmful level, he refuses SA counseling/OP referrals etc.

## 2015-02-11 ENCOUNTER — Inpatient Hospital Stay (HOSPITAL_COMMUNITY): Payer: 59

## 2015-02-11 ENCOUNTER — Encounter (HOSPITAL_COMMUNITY): Payer: Self-pay | Admitting: *Deleted

## 2015-02-11 LAB — CBC
HCT: 22.3 % — ABNORMAL LOW (ref 39.0–52.0)
HEMATOCRIT: 20 % — AB (ref 39.0–52.0)
Hemoglobin: 7.1 g/dL — ABNORMAL LOW (ref 13.0–17.0)
Hemoglobin: 7.9 g/dL — ABNORMAL LOW (ref 13.0–17.0)
MCH: 30.1 pg (ref 26.0–34.0)
MCH: 29.9 pg (ref 26.0–34.0)
MCHC: 35.5 g/dL (ref 30.0–36.0)
MCHC: 35.4 g/dL (ref 30.0–36.0)
MCV: 84.7 fL (ref 78.0–100.0)
MCV: 84.5 fL (ref 78.0–100.0)
PLATELETS: 154 10*3/uL (ref 150–400)
Platelets: 145 10*3/uL — ABNORMAL LOW (ref 150–400)
RBC: 2.36 MIL/uL — ABNORMAL LOW (ref 4.22–5.81)
RBC: 2.64 MIL/uL — ABNORMAL LOW (ref 4.22–5.81)
RDW: 13.1 % (ref 11.5–15.5)
RDW: 13.2 % (ref 11.5–15.5)
WBC: 6.9 10*3/uL (ref 4.0–10.5)
WBC: 6.3 10*3/uL (ref 4.0–10.5)

## 2015-02-11 LAB — PREPARE RBC (CROSSMATCH)

## 2015-02-11 MED ORDER — SODIUM CHLORIDE 0.9 % IV SOLN: Freq: Once | INTRAVENOUS | Status: DC

## 2015-02-11 MED ORDER — IOHEXOL 300 MG/ML  SOLN
100.0000 mL | Freq: Once | INTRAMUSCULAR | Status: AC | PRN
  Administered 2015-02-11: 100 mL via INTRAVENOUS

## 2015-02-11 MED ORDER — DIPHENHYDRAMINE HCL 25 MG PO CAPS
25.0000 mg | ORAL_CAPSULE | Freq: Four times a day (QID) | ORAL | Status: DC | PRN
  Administered 2015-02-11: 25 mg via ORAL
  Filled 2015-02-11: qty 1

## 2015-02-11 NOTE — Clinical Social Work Note (Signed)
Clinical Social Worker will sign off for now as social work intervention is no longer needed. Please consult [place] again if new need arises.  Barbette Or, Black
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
## 2015-02-11 NOTE — Progress Notes (Signed)
Called for CT scan to be done pt made NPO finished blood transfusion

## 2015-02-11 NOTE — Progress Notes (Signed)
Patient examined at bedside.  Left thigh is larger than right.  Compartments are full but soft.  No signs of compartment syndrome.  Mild serosanguinous drainage from left thigh incision.  NPO.  Awaiting results of CT scan.  Azucena Cecil, MD [place] 2:26 PM
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
## 2015-02-11 NOTE — Progress Notes (Signed)
OT Cancellation Note  Patient Details Name: Gregory Ibarra MRN: [id number] DOB: 05-25-1976   Cancelled Treatment:    Reason Eval/Treat Not Completed: Patient at procedure or test/ unavailable  Benito Mccreedy OTR/L [phone] 02/11/2015, 3:18 PM

## 2015-02-11 NOTE — Progress Notes (Signed)
Inpatient Rehabilitation  Pt. Had one unit of blood overnight and RN about to hang another unit at this time.  CT scan of left hip pending per Silvestre Gunner, PA.  Will monitor for medical readiness for IP Rehab pending bed availability.  Please call if questions.    [place] Admissions Coordinator Cell [phone] Office [phone]

## 2015-02-11 NOTE — Progress Notes (Addendum)
CT scan reviewed and does not show hematoma or active bleeding.  Findings consistent with generalized edema.  Will start iVAC to hip incisions to pull out drainage.  iVAC to stay for 5 days.  May discharge to rehab from ortho standpoint with VAC if needed.    Gregory Cecil, MD [facility] 4:57 PM

## 2015-02-12 ENCOUNTER — Inpatient Hospital Stay (HOSPITAL_COMMUNITY)
Admission: RE | Admit: 2015-02-12 | Discharge: 2015-02-18 | DRG: 560 | Disposition: A | Discharge status: Home / Self Care | Payer: 59 | Source: Intra-hospital | Attending: Physical Medicine & Rehabilitation | Admitting: Physical Medicine & Rehabilitation

## 2015-02-12 ENCOUNTER — Encounter (HOSPITAL_COMMUNITY): Payer: Self-pay | Admitting: *Deleted

## 2015-02-12 DIAGNOSIS — S7222XS Displaced subtrochanteric fracture of left femur, sequela: Secondary | ICD-10-CM | POA: Diagnosis not present

## 2015-02-12 DIAGNOSIS — D62 Acute posthemorrhagic anemia: Secondary | ICD-10-CM | POA: Diagnosis not present

## 2015-02-12 DIAGNOSIS — Z79899 Other long term (current) drug therapy: Secondary | ICD-10-CM

## 2015-02-12 DIAGNOSIS — F17210 Nicotine dependence, cigarettes, uncomplicated: Secondary | ICD-10-CM | POA: Diagnosis present

## 2015-02-12 DIAGNOSIS — S72362S Displaced segmental fracture of shaft of left femur, sequela: Secondary | ICD-10-CM | POA: Diagnosis not present

## 2015-02-12 DIAGNOSIS — S72322D Displaced transverse fracture of shaft of left femur, subsequent encounter for closed fracture with routine healing: Secondary | ICD-10-CM | POA: Diagnosis not present

## 2015-02-12 DIAGNOSIS — W458XXD Other foreign body or object entering through skin, subsequent encounter: Secondary | ICD-10-CM | POA: Diagnosis present

## 2015-02-12 DIAGNOSIS — R339 Retention of urine, unspecified: Secondary | ICD-10-CM | POA: Diagnosis present

## 2015-02-12 DIAGNOSIS — F1010 Alcohol abuse, uncomplicated: Secondary | ICD-10-CM | POA: Diagnosis present

## 2015-02-12 DIAGNOSIS — S72362J Displaced segmental fracture of shaft of left femur, subsequent encounter for open fracture type IIIA, IIIB, or IIIC with delayed healing: Secondary | ICD-10-CM | POA: Diagnosis not present

## 2015-02-12 DIAGNOSIS — Z882 Allergy status to sulfonamides status: Secondary | ICD-10-CM

## 2015-02-12 DIAGNOSIS — R61 Generalized hyperhidrosis: Secondary | ICD-10-CM | POA: Diagnosis not present

## 2015-02-12 DIAGNOSIS — M419 Scoliosis, unspecified: Secondary | ICD-10-CM | POA: Diagnosis present

## 2015-02-12 DIAGNOSIS — M4800 Spinal stenosis, site unspecified: Secondary | ICD-10-CM | POA: Diagnosis present

## 2015-02-12 DIAGNOSIS — J45909 Unspecified asthma, uncomplicated: Secondary | ICD-10-CM | POA: Diagnosis present

## 2015-02-12 DIAGNOSIS — S72362F Displaced segmental fracture of shaft of left femur, subsequent encounter for open fracture type IIIA, IIIB, or IIIC with routine healing: Secondary | ICD-10-CM | POA: Diagnosis not present

## 2015-02-12 DIAGNOSIS — S7222XD Displaced subtrochanteric fracture of left femur, subsequent encounter for closed fracture with routine healing: Principal | ICD-10-CM

## 2015-02-12 DIAGNOSIS — Z791 Long term (current) use of non-steroidal anti-inflammatories (NSAID): Secondary | ICD-10-CM

## 2015-02-12 DIAGNOSIS — S72362A Displaced segmental fracture of shaft of left femur, initial encounter for closed fracture: Secondary | ICD-10-CM | POA: Diagnosis present

## 2015-02-12 LAB — CBC
HCT: 21.9 % — ABNORMAL LOW (ref 39.0–52.0)
HEMATOCRIT: 23.5 % — AB (ref 39.0–52.0)
HEMOGLOBIN: 7.7 g/dL — AB (ref 13.0–17.0)
Hemoglobin: 8.2 g/dL — ABNORMAL LOW (ref 13.0–17.0)
MCH: 30.2 pg (ref 26.0–34.0)
MCH: 30.1 pg (ref 26.0–34.0)
MCHC: 35.2 g/dL (ref 30.0–36.0)
MCHC: 34.9 g/dL (ref 30.0–36.0)
MCV: 85.9 fL (ref 78.0–100.0)
MCV: 86.4 fL (ref 78.0–100.0)
PLATELETS: 170 10*3/uL (ref 150–400)
Platelets: 193 10*3/uL (ref 150–400)
RBC: 2.55 MIL/uL — ABNORMAL LOW (ref 4.22–5.81)
RBC: 2.72 MIL/uL — AB (ref 4.22–5.81)
RDW: 13.6 % (ref 11.5–15.5)
RDW: 13.8 % (ref 11.5–15.5)
WBC: 5.4 10*3/uL (ref 4.0–10.5)
WBC: 5.6 10*3/uL (ref 4.0–10.5)

## 2015-02-12 LAB — TYPE AND SCREEN
ABO/RH(D): O POS
ANTIBODY SCREEN: NEGATIVE
UNIT DIVISION: 0
Unit division: 0
Unit division: 0
Unit division: 0

## 2015-02-12 LAB — CREATININE, SERUM
Creatinine, Ser: 0.91 mg/dL (ref 0.50–1.35)
GFR calc non Af Amer: >90 mL/min (ref >90)

## 2015-02-12 MED ORDER — POLYETHYLENE GLYCOL 3350 17 G PO PACK
17.0000 g | PACK | Freq: Every day | ORAL | Status: DC | PRN
  Administered 2015-02-13: 17 g via ORAL
  Filled 2015-02-12 (×2): qty 1

## 2015-02-12 MED ORDER — ACETAMINOPHEN 325 MG PO TABS: 325.0000 mg | ORAL_TABLET | ORAL | Status: DC | PRN

## 2015-02-12 MED ORDER — ONDANSETRON HCL 4 MG/2ML IJ SOLN: 4.0000 mg | Freq: Four times a day (QID) | INTRAMUSCULAR | Status: DC | PRN

## 2015-02-12 MED ORDER — PANTOPRAZOLE SODIUM 40 MG PO TBEC
40.0000 mg | DELAYED_RELEASE_TABLET | Freq: Every day | ORAL | Status: DC
  Administered 2015-02-13 – 2015-02-18 (×6): 40 mg via ORAL
  Filled 2015-02-12 (×8): qty 1

## 2015-02-12 MED ORDER — ENOXAPARIN SODIUM 40 MG/0.4ML ~~LOC~~ SOLN
40.0000 mg | SUBCUTANEOUS | Status: DC
  Administered 2015-02-13 – 2015-02-15 (×3): 40 mg via SUBCUTANEOUS
  Filled 2015-02-12 (×6): qty 0.4

## 2015-02-12 MED ORDER — ONDANSETRON HCL 4 MG PO TABS: 4.0000 mg | ORAL_TABLET | Freq: Four times a day (QID) | ORAL | Status: DC | PRN

## 2015-02-12 MED ORDER — OXYCODONE HCL 5 MG PO TABS
10.0000 mg | ORAL_TABLET | ORAL | Status: DC | PRN
  Administered 2015-02-12 – 2015-02-14 (×6): 20 mg via ORAL
  Administered 2015-02-14: 10 mg via ORAL
  Administered 2015-02-14: 20 mg via ORAL
  Administered 2015-02-15: 10 mg via ORAL
  Administered 2015-02-15: 20 mg via ORAL
  Administered 2015-02-15: 10 mg via ORAL
  Administered 2015-02-16: 20 mg via ORAL
  Administered 2015-02-16 (×2): 10 mg via ORAL
  Administered 2015-02-16 – 2015-02-17 (×2): 20 mg via ORAL
  Administered 2015-02-17: 10 mg via ORAL
  Administered 2015-02-18: 15 mg via ORAL
  Filled 2015-02-12 (×2): qty 4
  Filled 2015-02-12: qty 2
  Filled 2015-02-12: qty 4
  Filled 2015-02-12 (×2): qty 2
  Filled 2015-02-12 (×3): qty 4
  Filled 2015-02-12 (×3): qty 2
  Filled 2015-02-12 (×2): qty 4
  Filled 2015-02-12: qty 3
  Filled 2015-02-12 (×2): qty 2
  Filled 2015-02-12 (×2): qty 4

## 2015-02-12 MED ORDER — TAMSULOSIN HCL 0.4 MG PO CAPS
0.8000 mg | ORAL_CAPSULE | Freq: Every day | ORAL | Status: DC
  Administered 2015-02-13 – 2015-02-18 (×6): 0.8 mg via ORAL
  Filled 2015-02-12 (×8): qty 2

## 2015-02-12 MED ORDER — DIPHENHYDRAMINE HCL 25 MG PO CAPS
25.0000 mg | ORAL_CAPSULE | Freq: Four times a day (QID) | ORAL | Status: DC | PRN
  Administered 2015-02-12: 25 mg via ORAL
  Filled 2015-02-12 (×2): qty 1

## 2015-02-12 MED ORDER — CEFAZOLIN SODIUM 1-5 GM-% IV SOLN
1.0000 g | Freq: Three times a day (TID) | INTRAVENOUS | Status: DC
  Administered 2015-02-12 – 2015-02-13 (×2): 1 g via INTRAVENOUS
  Filled 2015-02-12 (×5): qty 50

## 2015-02-12 MED ORDER — METHOCARBAMOL 500 MG PO TABS
500.0000 mg | ORAL_TABLET | Freq: Four times a day (QID) | ORAL | Status: DC | PRN
  Administered 2015-02-13 – 2015-02-18 (×6): 500 mg via ORAL
  Filled 2015-02-12 (×6): qty 1

## 2015-02-12 MED ORDER — DOCUSATE SODIUM 100 MG PO CAPS
100.0000 mg | ORAL_CAPSULE | Freq: Two times a day (BID) | ORAL | Status: DC
  Administered 2015-02-12 – 2015-02-18 (×12): 100 mg via ORAL
  Filled 2015-02-12 (×15): qty 1

## 2015-02-12 MED ORDER — MORPHINE SULFATE ER 15 MG PO TBCR
15.0000 mg | EXTENDED_RELEASE_TABLET | Freq: Two times a day (BID) | ORAL | Status: DC
  Administered 2015-02-12 – 2015-02-13 (×2): 15 mg via ORAL
  Filled 2015-02-12 (×2): qty 1

## 2015-02-12 MED ORDER — ENOXAPARIN SODIUM 40 MG/0.4ML ~~LOC~~ SOLN: 40.0000 mg | SUBCUTANEOUS | Status: DC

## 2015-02-12 MED ORDER — BACITRACIN ZINC 500 UNIT/GM EX OINT
TOPICAL_OINTMENT | Freq: Two times a day (BID) | CUTANEOUS | Status: DC
  Administered 2015-02-12 – 2015-02-13 (×2): 15.5556 via TOPICAL
  Administered 2015-02-13 – 2015-02-14 (×2): via TOPICAL
  Administered 2015-02-14 – 2015-02-18 (×7): 15.5556 via TOPICAL
  Filled 2015-02-12: qty 28.35

## 2015-02-12 MED ORDER — ALUM & MAG HYDROXIDE-SIMETH 200-200-20 MG/5ML PO SUSP: 30.0000 mL | ORAL | Status: DC | PRN

## 2015-02-12 MED ORDER — BETHANECHOL CHLORIDE 25 MG PO TABS
25.0000 mg | ORAL_TABLET | Freq: Four times a day (QID) | ORAL | Status: DC
  Administered 2015-02-12 – 2015-02-17 (×19): 25 mg via ORAL
  Filled 2015-02-12 (×24): qty 1

## 2015-02-12 MED ORDER — SORBITOL 70 % SOLN
30.0000 mL | Freq: Every day | Status: DC | PRN
  Administered 2015-02-13: 30 mL via ORAL
  Filled 2015-02-12: qty 30

## 2015-02-12 NOTE — Progress Notes (Signed)
VAC with serosanguinous drainage in canister.  Continue VAC.  Stable from ortho standpoint  N. Eduard Roux, MD [place] 8:39 AM
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
## 2015-02-12 NOTE — Progress Notes (Signed)
Received pt. As a transfer from [facility].Pt. Was oriented to unit and routine.Safety plan was explained,fall prevention plan was sign by RN and the pt.Pt. Skin is free of pressure ulcers.Keep monitoring pt. And assessing his needs.
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
## 2015-02-12 NOTE — Progress Notes (Signed)
PT Cancellation Note  Patient Details Name: Gregory Ibarra MRN: [id number] DOB: 10-14-1976   Cancelled Treatment:    Reason Eval/Treat Not Completed: Other (comment) (pt eating lunch, to D/C to CIR today)   [facility] 02/12/2015, 1:05 PM
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
## 2015-02-13 ENCOUNTER — Inpatient Hospital Stay (HOSPITAL_COMMUNITY): Payer: Self-pay

## 2015-02-13 ENCOUNTER — Inpatient Hospital Stay (HOSPITAL_COMMUNITY): Payer: 59 | Admitting: Physical Therapy

## 2015-02-13 DIAGNOSIS — S72362S Displaced segmental fracture of shaft of left femur, sequela: Secondary | ICD-10-CM

## 2015-02-13 LAB — CBC WITH DIFFERENTIAL/PLATELET
BASOS ABS: 0 10*3/uL (ref 0.0–0.1)
BASOS PCT: 0 % (ref 0–1)
Eosinophils Absolute: 0.1 10*3/uL (ref 0.0–0.7)
Eosinophils Relative: 2 % (ref 0–5)
HEMATOCRIT: 24.4 % — AB (ref 39.0–52.0)
Hemoglobin: 8.4 g/dL — ABNORMAL LOW (ref 13.0–17.0)
Lymphocytes Relative: 23 % (ref 12–46)
Lymphs Abs: 1.3 10*3/uL (ref 0.7–4.0)
MCH: 29.8 pg (ref 26.0–34.0)
MCHC: 34.4 g/dL (ref 30.0–36.0)
MCV: 86.5 fL (ref 78.0–100.0)
MONO ABS: 0.6 10*3/uL (ref 0.1–1.0)
Monocytes Relative: 10 % (ref 3–12)
Neutro Abs: 3.8 10*3/uL (ref 1.7–7.7)
Neutrophils Relative %: 65 % (ref 43–77)
Platelets: 204 10*3/uL (ref 150–400)
RBC: 2.82 MIL/uL — ABNORMAL LOW (ref 4.22–5.81)
RDW: 13.7 % (ref 11.5–15.5)
WBC: 5.9 10*3/uL (ref 4.0–10.5)

## 2015-02-13 LAB — COMPREHENSIVE METABOLIC PANEL
ALT: 33 U/L (ref 0–53)
AST: 89 U/L — AB (ref 0–37)
Albumin: 3.2 g/dL — ABNORMAL LOW (ref 3.5–5.2)
Alkaline Phosphatase: 48 U/L (ref 39–117)
Anion gap: 10 (ref 5–15)
BILIRUBIN TOTAL: 2.8 mg/dL — AB (ref 0.3–1.2)
CALCIUM: 8.8 mg/dL (ref 8.4–10.5)
CHLORIDE: 99 mmol/L (ref 96–112)
CO2: 28 mmol/L (ref 19–32)
Creatinine, Ser: 0.98 mg/dL (ref 0.50–1.35)
GFR calc Af Amer: >90 mL/min (ref >90)
GFR calc non Af Amer: >90 mL/min (ref >90)
Glucose, Bld: 110 mg/dL — ABNORMAL HIGH (ref 70–99)
Potassium: 3.3 mmol/L — ABNORMAL LOW (ref 3.5–5.1)
Sodium: 137 mmol/L (ref 135–145)
TOTAL PROTEIN: 5.9 g/dL — AB (ref 6.0–8.3)

## 2015-02-13 MED ORDER — OXYCODONE HCL ER 20 MG PO T12A
20.0000 mg | EXTENDED_RELEASE_TABLET | Freq: Two times a day (BID) | ORAL | Status: DC
  Administered 2015-02-13 – 2015-02-18 (×10): 20 mg via ORAL
  Filled 2015-02-13 (×10): qty 1

## 2015-02-13 MED ORDER — ENSURE ENLIVE PO LIQD
237.0000 mL | Freq: Every day | ORAL | Status: DC
  Administered 2015-02-13 – 2015-02-16 (×4): 237 mL via ORAL

## 2015-02-13 MED ORDER — POTASSIUM CHLORIDE CRYS ER 20 MEQ PO TBCR
20.0000 meq | EXTENDED_RELEASE_TABLET | Freq: Every day | ORAL | Status: AC
  Administered 2015-02-13 – 2015-02-15 (×3): 20 meq via ORAL
  Filled 2015-02-13 (×3): qty 1
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
## 2015-02-13 NOTE — Progress Notes (Signed)
Patient information reviewed and entered into eRehab system by Jarris Kortz, RN, CRRN, PPS Coordinator.  Information including medical coding and functional independence measure will be reviewed and updated through discharge.    
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
## 2015-02-13 NOTE — Progress Notes (Signed)
VASCULAR LAB PRELIMINARY  PRELIMINARY  PRELIMINARY  PRELIMINARY  Bilateral lower extremity venous duplex completed.    Preliminary report:  Bilateral:  No evidence of DVT, superficial thrombosis, or Baker's Cyst.   Lakisha Peyser, RVS 02/13/2015, 2:34 PM

## 2015-02-13 NOTE — Progress Notes (Signed)
3/24  [employer] NP called after pt req prn benadryl for c/o's itching "all over". Noted the order was not entered for rehab stay.  Telephone Order given for 73m po q6hrs prn itching. Pt did state 562mmore effective when had on previous unit. NP prefers attending or PA assess pts need for above and reorder if need.
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
## 2015-02-13 NOTE — Care Management Note (Signed)
[facility] Individual Statement of Services  Patient Name:  Gregory Ibarra  Date:  02/13/2015  Welcome to the [facility].  Our goal is to provide you with an individualized program based on your diagnosis and situation, designed to meet your specific needs.  With this comprehensive rehabilitation program, you will be expected to participate in at least 3 hours of rehabilitation therapies Monday-Friday, with modified therapy programming on the weekends.  Your rehabilitation program will include the following services:  Physical Therapy (PT), Occupational Therapy (OT), 24 hour per day rehabilitation nursing, Therapeutic Recreaction (TR), Neuropsychology, Case Management (Social Worker), Rehabilitation Medicine, Nutrition Services and Pharmacy Services  Weekly team conferences will be held on Tuesdays to discuss your progress.  Your Social Worker will talk with you frequently to get your input and to update you on team discussions.  Team conferences with you and your family in attendance may also be held.  Expected length of stay: 7-9 days  Overall anticipated outcome: modified independent  Depending on your progress and recovery, your program may change. Your Social Worker will coordinate services and will keep you informed of any changes. Your Social Worker's name and contact numbers are listed  below.  The following services may also be recommended but are not provided by the [facility] will be made to provide these services after discharge if needed.  Arrangements include referral to agencies that provide these services.  Your insurance has been verified to be:  None Your primary doctor is:  None (Education officer, museum can assist with establishing a primary care provider)  Pertinent information  will be shared with your doctor and your insurance company.  Social Worker:  [place], [place] or [phone]   Information discussed with and copy given to patient by: Lennart Pall, 02/13/2015, 3:27 PM

## 2015-02-14 ENCOUNTER — Inpatient Hospital Stay (HOSPITAL_COMMUNITY): Payer: 59 | Admitting: Physical Therapy

## 2015-02-14 ENCOUNTER — Inpatient Hospital Stay (HOSPITAL_COMMUNITY): Payer: 59

## 2015-02-14 ENCOUNTER — Inpatient Hospital Stay (HOSPITAL_COMMUNITY): Payer: Self-pay

## 2015-02-14 DIAGNOSIS — D62 Acute posthemorrhagic anemia: Secondary | ICD-10-CM

## 2015-02-14 DIAGNOSIS — S72362F Displaced segmental fracture of shaft of left femur, subsequent encounter for open fracture type IIIA, IIIB, or IIIC with routine healing: Secondary | ICD-10-CM

## 2015-02-14 DIAGNOSIS — R61 Generalized hyperhidrosis: Secondary | ICD-10-CM

## 2015-02-14 MED ORDER — CLONIDINE HCL 0.1 MG PO TABS
0.1000 mg | ORAL_TABLET | Freq: Two times a day (BID) | ORAL | Status: DC
  Administered 2015-02-14 – 2015-02-18 (×9): 0.1 mg via ORAL
  Filled 2015-02-14 (×14): qty 1
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
## 2015-02-14 NOTE — Progress Notes (Signed)
Pt called about 2315 states he needs to go home," now".  He was working on his computer and made no eye contact. I asked for clarification and he just continued to say he needs to go now. I asked if he means AMA and reply was yes. I told him i need to talk to my supervisor. Discussed with charge nurse who spoke to pt and then called house supervisor for information. I returned to the room about 10 min later and he did not look up but was on phone and computer. I left the room and returned 5 min later; he was laying down in bed now and i told him it cannot work Midwife and he replied i know. No further comment from him. Appearing asleep during rounds.

## 2015-02-14 NOTE — Progress Notes (Signed)
Occupational Therapy Session Note  Patient Details  Name: Gregory Ibarra MRN: [id number] Date of Birth: [birth year]  Today's Date: 02/14/2015 OT Individual Time: 0800-0900 and 1300-1400  OT Individual Time Calculation (min): 60 min and 60 min    Short Term Goals: Week 1:  OT Short Term Goal 1 (Week 1): STGs=LTGs due to short ELOS  Skilled Therapeutic Interventions/Progress Updates:    Session 1: Pt seen for ADL retraining with focus on functional transfers, standing balance, activity tolerance, and use of AE. Pt received supine in bed agreeable to shower this AM. Ambulated to walk-in shower with use of RW at supervision level with therapist assisting with wound vac. Completed bathing at supervision level with use of LH. Pt ambulated back to bed and completed dressing sitting EOB. Pt utilized reacher without difficulty to don pants then required assist to don socks. Pt completed stand pivot transfer bed>recliner chair at supervision level. Pt required frequent rest breaks throughout therapy session due to fatigue and pain. HR 90-120 throughout session. Pt left with all needs in reach.  Session 2: Pt seen for 1:1 OT session with focus on bed mobility, dynamic standing balance, wound vac management, and activity tolerance. Pt received sitting in w/c. Propelled self to therapy gym at supervision level. Completed stand pivot transfer to elevated mat table to simulate bed at home. Completed supine<>sit at supervision level with use of sheet to assist with managing LLE on/off bed. Pt completed 3x times at supervision level with increased time and rest breaks between each. Engaged in dynamic standing balance task of tossing ball out of BOS with no LOB. Noted pt's L hip elevated secondary to leg length discrepancy. Questioning if premorbid due to hx of scoliosis and spinal stenosis. Pt ambulated back to room approx 70' at supervision level using RW. Provided pt with walker bag at beginning of session and pt able  to manage wound vac and cords without difficulty.   Therapy Documentation Precautions:  Precautions Precautions: Fall Precaution Comments: wound vac L hip Restrictions Weight Bearing Restrictions: No LLE Weight Bearing: Weight bearing as tolerated General:   Vital Signs:  Pain: 9/10 pain in LLE  See FIM for current functional status  Therapy/Group: Individual Therapy  Duayne Cal 02/14/2015, 10:59 AM

## 2015-02-14 NOTE — Progress Notes (Signed)
Physical Therapy Session Note  Patient Details  Name: Gregory Ibarra MRN: [id number] Date of Birth: 07/05/1976  Today's Date: 02/14/2015 PT Individual Time: 1000-1100 PT Individual Time Calculation (min): 60 min   Short Term Goals: Week 1:  PT Short Term Goal 1 (Week 1): = LTGs due to anticipated LOS  Skilled Therapeutic Interventions/Progress Updates:  Pt received in w/c in room; agreeable to therapy.  Treatment session focused on w/c parts/wound vac management, functional ambulation in a home environment, and dynamic standing balance. Pt propelled w/c 32f with B UEs to/from room with supervision.  Pt demonstrated management of w/c parts, including brakes and leg rests, with min cueing/min A, but required mod assistance for wound vac management during w/c mobility and using rolling walker. Pt ambulated 266fmin A/min guard with rolling walker in ADL apartment, requiring one rest break (suggested by SPT).  Pt able to tolerate dynamic standing for 9 mins in ADL kitchen, reaching outside BOS to look and retrieve items from upper cabinets. Supervision/ min cueing required for safety. HR WNL after standing x9 minutes.  Pt was left in w/c with all needs within reach. This SPT reiterated the importance of calling the nurse if pt wants to transfer w/c<>bed.         Therapy Documentation Precautions:  Precautions Precautions: Fall Precaution Comments: wound vac L hip Restrictions Weight Bearing Restrictions: No LLE Weight Bearing: Weight bearing as tolerated Vital Signs: Therapy Vitals Pulse Rate: (!) 113 (Following activity) Oxygen Therapy SpO2: 98 % (Range: 96-98%) O2 Device: Not Delivered Pain: Pain Assessment Pain Assessment: 0-10 Pain Score: 10-Worst pain ever (10/10 beginning of treat, decreased to 8/10 by end of treatment) Pain Type: Surgical pain Pain Location: Knee Pain Orientation: Left Pain Descriptors / Indicators: Grimacing Pain Intervention(s): Medication (See  eMAR) Locomotion : Ambulation Ambulation/Gait Assistance: 4: Min guard;4: Min asFinancial controlleristance: 50   See FIM for current functional status  Therapy/Group: Individual Therapy  Lalania Haseman 02/14/2015, 11:12 AM

## 2015-02-15 ENCOUNTER — Inpatient Hospital Stay (HOSPITAL_COMMUNITY): Payer: 59 | Admitting: Occupational Therapy

## 2015-02-15 DIAGNOSIS — S72362J Displaced segmental fracture of shaft of left femur, subsequent encounter for open fracture type IIIA, IIIB, or IIIC with delayed healing: Secondary | ICD-10-CM

## 2015-02-15 LAB — URINALYSIS, ROUTINE W REFLEX MICROSCOPIC
Bilirubin Urine: NEGATIVE
Glucose, UA: NEGATIVE mg/dL
HGB URINE DIPSTICK: NEGATIVE
KETONES UR: NEGATIVE mg/dL
LEUKOCYTES UA: NEGATIVE
Nitrite: NEGATIVE
PROTEIN: NEGATIVE mg/dL
Specific Gravity, Urine: 1.018 (ref 1.005–1.030)
Urobilinogen, UA: 1 mg/dL (ref 0.0–1.0)
pH: 6.5 (ref 5.0–8.0)

## 2015-02-15 LAB — BASIC METABOLIC PANEL
Anion gap: 9 (ref 5–15)
BUN: 9 mg/dL (ref 6–23)
CALCIUM: 8.9 mg/dL (ref 8.4–10.5)
CHLORIDE: 101 mmol/L (ref 96–112)
CO2: 27 mmol/L (ref 19–32)
Creatinine, Ser: 0.75 mg/dL (ref 0.50–1.35)
GFR calc Af Amer: >90 mL/min (ref >90)
GFR calc non Af Amer: >90 mL/min (ref >90)
Glucose, Bld: 115 mg/dL — ABNORMAL HIGH (ref 70–99)
Potassium: 4.1 mmol/L (ref 3.5–5.1)
SODIUM: 137 mmol/L (ref 135–145)

## 2015-02-15 LAB — CBC
HCT: 25.5 % — ABNORMAL LOW (ref 39.0–52.0)
HEMOGLOBIN: 8.5 g/dL — AB (ref 13.0–17.0)
MCH: 29.5 pg (ref 26.0–34.0)
MCHC: 33.3 g/dL (ref 30.0–36.0)
MCV: 88.5 fL (ref 78.0–100.0)
PLATELETS: 267 10*3/uL (ref 150–400)
RBC: 2.88 MIL/uL — ABNORMAL LOW (ref 4.22–5.81)
RDW: 14.5 % (ref 11.5–15.5)
WBC: 8.9 10*3/uL (ref 4.0–10.5)
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
## 2015-02-15 NOTE — Progress Notes (Signed)
Occupational Therapy Session Note  Patient Details  Name: Gregory Ibarra MRN: [id number] Date of Birth: Feb 29, 1976  Today's Date: 02/15/2015 OT Individual Time:  -    1030-1120  (39  Min)      Short Term Goals: Week 1:  OT Short Term Goal 1 (Week 1): STGs=LTGs due to short ELOS      Skilled Therapeutic Interventions/Progress Updates:      Focus of treatment was  transfers,  standing balance, herapeutic activities, Pt. Ambulated around room with RW with SBA.  Pt. Unable to shower due to wound vac being disconnected.  Pt. Sponged bath EOB with SBA and assist with teds and footies.  Pt. Pt elected to wait on clean clothes to wash peri area.  Left pt on EOB with safety belt on and call bell,phone within reach.      Therapy Documentation Precautions:  Precautions Precautions: Fall Precaution Comments: wound vac L hip Restrictions Weight Bearing Restrictions: Yes LLE Weight Bearing: Weight bearing as tolerated       Pain: Pain Assessment Pain Score: 8   L LE       Other Treatments:    See FIM for current functional status  Therapy/Group: Individual Therapy  Lisa Roca 02/15/2015, 11:17 AM

## 2015-02-16 ENCOUNTER — Inpatient Hospital Stay (HOSPITAL_COMMUNITY): Payer: 59

## 2015-02-16 ENCOUNTER — Inpatient Hospital Stay (HOSPITAL_COMMUNITY): Payer: 59 | Admitting: Physical Therapy

## 2015-02-16 MED ORDER — ASPIRIN EC 325 MG PO TBEC
325.0000 mg | DELAYED_RELEASE_TABLET | Freq: Every day | ORAL | Status: DC
  Administered 2015-02-16 – 2015-02-18 (×3): 325 mg via ORAL
  Filled 2015-02-16 (×4): qty 1

## 2015-02-16 NOTE — Progress Notes (Signed)
Occupational Therapy Session Note  Patient Details  Name: Gregory Ibarra MRN: [id number] Date of Birth: December 20, 1975  Today's Date: 02/16/2015 OT Individual Time: 0900-1000 and 1300-1400 OT Individual Time Calculation (min): 60 min and 60 min     Short Term Goals: Week 1:  OT Short Term Goal 1 (Week 1): STGs=LTGs due to short ELOS  Skilled Therapeutic Interventions/Progress Updates:    Session 1: Pt seen for ADL retraining with focus on functional mobility, standing balance, activity tolerance, and use of AE. Pt received sitting in w/c. Ambulated around room with RW at supervision level to retrieve bathing and dressing needs. Pt completed bathing at shower level at distant supervision level with use of LH sponge for BLE. Completed dressing sitting in w/c with use of reacher and sock-aid with increased time. Pt required supervision for standing balance to manage clothing around waist without UE support. Pt ambulated around room to pick up items from floor at supervision level and without reacher. Discussed DME and pt reporting only needing BSC at this time as family was checking for shower chair. At end of session pt left sitting in w/c with all needs in reach.  Session 2: Pt seen for 1:1 OT session with focus on functional mobility, activity tolerance, strengthening, and dynamic standing balance. Pt received supine in bed. Ambulated to ADL apartment and completed simple meal prep task at Mod I level. Pt demonstrated good safety when reaching into low and overhead cabinets, as well as transporting items counter<>counter. Pt ambulated on/off elevator and in community environment of hotel lobby at supervision level using RW. Practiced propelled self in w/c and ambulating on uneven and sloped surfaces outside without difficulty. Pt propelled self in w/c on/off elevators and back to room for BUE strength and increasing functional endurance. Completed BLE strengthening exercises while in bed with min cues and  use of handout. Pt completed 2 sets of ankles pumps x20 reps, short kicks with 5 sec hold x10 reps, heel slides x10 reps, glute sets with 5 sec hold x10 reps, and quad sets with 5 sec hold x 10 reps. At end of session pt left supine in bed with all needs in reach.  Therapy Documentation Precautions:  Precautions Precautions: Fall Precaution Comments: wound vac L hip Restrictions Weight Bearing Restrictions: Yes LLE Weight Bearing: Weight bearing as tolerated General:   Vital Signs: Therapy Vitals Temp: 98.3 F (36.8 C) Temp Source: Oral Pulse Rate: 74 Resp: 18 BP: 122/73 mmHg Patient Position (if appropriate): Lying Oxygen Therapy SpO2: 100 % O2 Device: Not Delivered Pain: Pain Assessment Pain Assessment: 0-10 Pain Score: 8  Pain Type: Acute pain Pain Location: Hip Pain Orientation: Left Pain Descriptors / Indicators: Aching;Pressure;Discomfort Pain Onset: On-going Pain Intervention(s): Repositioned;Ambulation/increased activity  See FIM for current functional status  Therapy/Group: Individual Therapy  Duayne Cal 02/16/2015, 9:28 AM

## 2015-02-16 NOTE — Progress Notes (Signed)
Thigh swelling is stable, no worse. Incisions c/d/i VAC removed, dry dressings applied Doppler neg for DVT Recommend stopping lovenox, consider asa 325 daily  N. Eduard Roux, MD [place] 7:46 AM

## 2015-02-16 NOTE — Progress Notes (Signed)
Physical Therapy Session Note  Patient Details  Name: Gregory Ibarra MRN: [id number] Date of Birth: September 23, 1976  Today's Date: 02/16/2015 PT Individual Time: 0800-0900 PT Individual Time Calculation (min): 60 min   Short Term Goals: Week 1:  PT Short Term Goal 1 (Week 1): = LTGs due to anticipated LOS  Skilled Therapeutic Interventions/Progress Updates:   Patient received sitting in recliner, RN present to administer pain medication. Session focused on functional transfers, ambulation, stairs, and LLE ROM/strengthening. Patient reported wound vac d/c'ed this AM. Patient performed squat pivot transfer recliner > wheelchair and managed elevating leg rests with supervision. Patient propelled wheelchair using BUE throughout rehab unit with mod I. Discussed discharge planning and DME needs. Patient performed simulated car transfer to SUV height using RW with supervision and cues for technique. If patient needs to use guard rail due to seat height on actual car, educated on stepping up backwards leading with RLE, pt verbalized understanding. Performed bed mobility on raised mat table to simulate home set up with high bed at supervision level with use of sheet for lifting LLE and discussed use of step stool if needed to reach bed, using RW to step up backwards leading with RLE. Supine therex to fatigue: ankle pumps, SAQ over bolster, glute sets, quad sets, heel slides, assisted hip abduction. Patient provided with HEP handout of supine exercises (in binder in room). Gait training using RW x 100 ft with supervision, patient demonstrates antalgic step-to pattern. Patient negotiated curb step using RW with verbal cues for safe step-to sequencing x 2, min A > supervision. Patient negotiated up/down 4 stairs using 2 rails with supervision, min cues for sequencing. In room, patient with questions regarding x-ray of LLE, answered questions and oriented patient to surgical procedure via imaging. Patient left sitting in  wheelchair to await OT session.  Therapy Documentation Precautions:  Precautions Precautions: Fall Precaution Comments: wound vac L hip Restrictions Weight Bearing Restrictions: Yes LLE Weight Bearing: Weight bearing as tolerated Pain: Pain Assessment Pain Assessment: 0-10 Pain Score: 8  Pain Type: Acute pain Pain Location: Hip Pain Orientation: Left Pain Descriptors / Indicators: Aching;Pressure;Discomfort Pain Onset: On-going Pain Intervention(s): Repositioned;Ambulation/increased activity  See FIM for current functional status  Therapy/Group: Individual Therapy  Laretta Alstrom 02/16/2015, 10:43 AM
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
## 2015-02-17 ENCOUNTER — Inpatient Hospital Stay (HOSPITAL_COMMUNITY): Payer: Self-pay

## 2015-02-17 ENCOUNTER — Inpatient Hospital Stay (HOSPITAL_COMMUNITY): Payer: Self-pay | Admitting: Occupational Therapy

## 2015-02-17 ENCOUNTER — Inpatient Hospital Stay (HOSPITAL_COMMUNITY): Payer: 59 | Admitting: Physical Therapy

## 2015-02-17 MED ORDER — BETHANECHOL CHLORIDE 10 MG PO TABS
10.0000 mg | ORAL_TABLET | Freq: Four times a day (QID) | ORAL | Status: DC
  Administered 2015-02-17 – 2015-02-18 (×5): 10 mg via ORAL
  Filled 2015-02-17 (×8): qty 1

## 2015-02-17 NOTE — Progress Notes (Signed)
Spoke with orthopedic services Dr.XU and was advised to keep suture staples in place until follow-up in the office in one week.
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
## 2015-02-17 NOTE — Progress Notes (Signed)
Occupational Therapy Note  Patient Details  Name: Gregory Ibarra MRN: [id number] Date of Birth: 02/25/1976  Today's Date: 02/17/2015 OT Individual Time: 1300-1330 OT Individual Time Calculation (min): 30 min   Pt c/o 7/10 pain in LLE; RN aware and pt premedicated prior to therapy session Individual Therapy  Pt amb with RW to ADL apartment and therapy gym.  Pt practiced accessing walk in shower by stepping over 4" barrier, sitting on shower chair, and exiting shower.  Pt practiced bed mobility with mat at simulated height of home bed.  Pt completed all tasks at mod I level.  Pt did not exhibit any unsafe behaviors.  Pt pleased with progress and happy to go home tomorrow.   Leotis Shames [facility] 02/17/2015, 2:23 PM

## 2015-02-17 NOTE — Progress Notes (Signed)
Occupational Therapy Session Note  Patient Details  Name: Gregory Ibarra MRN: [id number] Date of Birth: 02/12/1976  Today's Date: 02/17/2015 OT Individual Time: 1000-1100 OT Individual Time Calculation (min): 60 min    Short Term Goals: Week 1:  OT Short Term Goal 1 (Week 1): STGs=LTGs due to short ELOS  Skilled Therapeutic Interventions/Progress Updates:    Pt engaged in BADLs including bathing at shower level and dressing with sit<>stand from seat.  Pt amb with RW in room to gather clothing and supplies prior to entering bathroom for shower.  Pt completed setup appropriately prior to entering shower and completing bathing tasks using AE appropriately.  Pt completed dressing tasks using AE appropriately for LB dressing.  Pt stated he would purchase AE from gift shop when he discharges tomorrow. Pt gathered soiled clothing and towels from floor and placed them in appropriate containers.  Pt completed all tasks at mod I level.  Therapy Documentation Precautions:  Precautions Precautions: Fall Precaution Comments: wound vac L hip Restrictions Weight Bearing Restrictions: Yes LLE Weight Bearing: Weight bearing as tolerated   Pain: Pain Assessment Pain Assessment: 0-10 Pain Score: 4  Pain Type: Acute pain Pain Location: Leg Pain Orientation: Left Pain Descriptors / Indicators: Tightness Pain Onset: On-going Pain Intervention(s): RN made aware;Repositioned  See FIM for current functional status  Therapy/Group: Individual Therapy  Leroy Libman 02/17/2015, 11:02 AM

## 2015-02-17 NOTE — Discharge Summary (Signed)
Discharge summary job # [id number]

## 2015-02-17 NOTE — Progress Notes (Signed)
Physical Therapy Discharge Summary  Patient Details  Name: Gregory Ibarra MRN: [id number] Date of Birth: 1976/01/19  Today's Date: 02/17/2015 PT Individual Time: 0900-1000 PT Individual Time Calculation (min): 60 min    Patient has met 8 of 8 long term goals due to improved activity tolerance, improved balance, improved postural control, increased strength, increased range of motion, decreased pain, ability to compensate for deficits and wound vac discharged.  Patient to discharge at an ambulatory household level Modified Independent.   Patient's care partner unavailable to provide the necessary physical assistance at discharge.  Reasons goals not met: NA  Recommendation:  Patient will benefit from ongoing skilled PT services in home health setting to continue to advance safe functional mobility, address ongoing impairments in LLE pain, edema, strength, ROM, activity tolerance, dynamic balance, and minimize fall risk.  Equipment: RW and 20 x 18 manual wheelchair  Reasons for discharge: treatment goals met and discharge from hospital  Patient/family agrees with progress made and goals achieved: Yes  Skilled Therapeutic Intervention Patient received in room, less swelling noted in LLE with patient reporting improved mobility. Patient at mod I level for functional mobility except supervision for stairs. Patient performed car transfer to SUV height with supervision. 10 MWT using RW improved to 0.4 m/s from 0.13 m/s on evaluation. Patient performed standing LLE exercises using RW for UE support and provided with handout for HEP. See below for further discharge details. Patient made mod I in room and answered all patient's questions regarding discharge and f/u HHPT.   PT Discharge Precautions/Restrictions Restrictions Weight Bearing Restrictions: Yes LLE Weight Bearing: Weight bearing as tolerated Vital Signs Therapy Vitals Temp: 98.2 F (36.8 C) Temp Source: Oral Pulse Rate: 86 Resp:  20 BP: 127/75 mmHg Patient Position (if appropriate): Lying Oxygen Therapy SpO2: 99 % O2 Device: Not Delivered Pain Pain Assessment Pain Assessment: 0-10 Pain Score: 7  Pain Type: Acute pain Pain Location: Leg Pain Orientation: Left Pain Descriptors / Indicators: Tightness Pain Frequency: Intermittent Pain Onset: On-going Pain Intervention(s): Ambulation/increased activity (premedicated) Vision/Perception   No changes from baseline  Cognition Overall Cognitive Status: Within Functional Limits for tasks assessed Arousal/Alertness: Awake/alert Orientation Level: Oriented X4 Attention: Selective Selective Attention: Appears intact Memory: Appears intact Awareness: Appears intact Problem Solving: Appears intact Safety/Judgment: Appears intact Sensation Sensation Light Touch: Appears Intact Hot/Cold: Appears Intact Coordination Gross Motor Movements are Fluid and Coordinated: No Fine Motor Movements are Fluid and Coordinated: Yes Coordination and Movement Description: limited by pain in LLE Motor  Motor Motor: Within Functional Limits Motor - Discharge Observations: limited by pain/swelling in LLE  Mobility Bed Mobility Bed Mobility: Sit to Supine;Supine to Sit Supine to Sit: HOB flat;6: Modified independent (Device/Increase time) Sit to Supine: 6: Modified independent (Device/Increase time);HOB flat Sit to Supine - Details (indicate cue type and reason): independent use of sheet to lift LLE Transfers Transfers: Yes Stand Pivot Transfers: With armrests;6: Modified independent (Device/Increase time) Locomotion  Ambulation Ambulation: Yes Ambulation/Gait Assistance: 6: Modified independent (Device/Increase time) Ambulation Distance (Feet): 150 Feet Assistive device: Rolling walker Gait Gait: Yes Gait Pattern: Impaired Gait Pattern: Step-to pattern;Decreased stance time - left;Decreased weight shift to left;Antalgic;Trunk flexed;Decreased step length - right Gait  velocity: 10 MWT = 0.4 m/s High Level Ambulation High Level Ambulation: Side stepping Side Stepping: mod I Stairs / Additional Locomotion Stairs: Yes Stairs Assistance: 5: Supervision Stairs Assistance Details: Verbal cues for sequencing Stair Management Technique: Two rails;Step to pattern;Forwards Number of Stairs: 12 Height of Stairs: 6 Ramp: 6: Modified  independent (Device) Curb: 5: Psychiatric nurse: Yes Wheelchair Assistance: 6: Modified independent (Device/Increase time) Environmental health practitioner: Both upper extremities Wheelchair Parts Management: Independent Distance: 150 FT  Trunk/Postural Assessment  Cervical Assessment Cervical Assessment: Within Functional Limits Thoracic Assessment Thoracic Assessment: Within Functional Limits Lumbar Assessment Lumbar Assessment: Within Functional Limits Postural Control Postural Control: Deficits on evaluation  Balance Balance Balance Assessed: Yes Static Standing Balance Static Standing - Balance Support: During functional activity;Bilateral upper extremity supported Static Standing - Level of Assistance: 6: Modified independent (Device/Increase time) Dynamic Standing Balance Dynamic Standing - Balance Support: Bilateral upper extremity supported;During functional activity Dynamic Standing - Level of Assistance: 6: Modified independent (Device/Increase time) Extremity Assessment  RUE Assessment RUE Assessment: Within Functional Limits (5/5 strength) LUE Assessment LUE Assessment: Within Functional Limits (5/5 strength) RLE Assessment RLE Assessment: Within Functional Limits LLE Assessment LLE Assessment: Exceptions to [facility] LLE Strength LLE Overall Strength: Deficits;Due to pain LLE Overall Strength Comments: grossly 3+/5 to 4/5 throughout  See FIM for current functional status  Laretta Alstrom 02/17/2015, 7:56 AM

## 2015-02-17 NOTE — Progress Notes (Signed)
Occupational Therapy Session Note  Patient Details  Name: Gregory Ibarra MRN: [id number] Date of Birth: 1976-06-10  Today's Date: 02/17/2015 OT Individual Time: 0800-0830 OT Individual Time Calculation (min): 30 min    Skilled Therapeutic Interventions/Progress Updates:    Pt utilized the sockaide for donning gripper socks with setup and then ambulated down to the gym for therapy.  Pt reports feeling confident with all transfers and selfcare tasks at this time.  He states his girlfriend is checking on a bedside toilet for use in the shower and over the toilet at discharge.  Once in the gym had pt perform 2 set of 10 reps for knee extension on the LLE in sitting.  Pt unable to achieve full knee extension without leaning backwards.  Still with significant swelling in the in the left thigh.  Transitioned to supine for 2 sets of 10 repetitions of hip flexion with knee extension.  Pt unable to maintain full extension and needed min assist to complete.  Returned to room with modified independence at end of session using the RW for support.   Therapy Documentation Precautions:  Precautions Precautions: None Precaution Comments:   Restrictions Weight Bearing Restrictions: No LLE Weight Bearing: Weight bearing as tolerated  Pain: Pain Assessment Pain Assessment: 0-10 Pain Score: 4  Faces Pain Scale: Hurts a little bit Pain Type: Acute pain Pain Location: Leg Pain Orientation: Left Pain Descriptors / Indicators: Tightness Pain Onset: On-going Pain Intervention(s): RN made aware;Repositioned Multiple Pain Sites: No ADL: See FIM for current functional status  Therapy/Group: Individual Therapy  Lamaj Metoyer OTR/L 02/17/2015, 12:10 PM
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
## 2015-02-18 ENCOUNTER — Inpatient Hospital Stay (HOSPITAL_COMMUNITY): Payer: 59

## 2015-02-18 ENCOUNTER — Inpatient Hospital Stay (HOSPITAL_COMMUNITY): Payer: 59 | Admitting: Physical Therapy

## 2015-02-18 DIAGNOSIS — S7222XS Displaced subtrochanteric fracture of left femur, sequela: Secondary | ICD-10-CM

## 2015-02-18 LAB — CBC
HEMATOCRIT: 28.7 % — AB (ref 39.0–52.0)
Hemoglobin: 9.3 g/dL — ABNORMAL LOW (ref 13.0–17.0)
MCH: 29.5 pg (ref 26.0–34.0)
MCHC: 32.4 g/dL (ref 30.0–36.0)
MCV: 91.1 fL (ref 78.0–100.0)
PLATELETS: 391 10*3/uL (ref 150–400)
RBC: 3.15 MIL/uL — AB (ref 4.22–5.81)
RDW: 15 % (ref 11.5–15.5)
WBC: 9.7 10*3/uL (ref 4.0–10.5)

## 2015-02-18 MED ORDER — TAMSULOSIN HCL 0.4 MG PO CAPS: 0.8000 mg | ORAL_CAPSULE | Freq: Every day | ORAL | Status: DC

## 2015-02-18 MED ORDER — OXYCODONE HCL ER 20 MG PO T12A
EXTENDED_RELEASE_TABLET | ORAL | Status: DC
Start: 2015-02-18 — End: 2015-02-18

## 2015-02-18 MED ORDER — OXYCODONE HCL 20 MG PO TABS: ORAL_TABLET | ORAL | Status: DC

## 2015-02-18 MED ORDER — DIAZEPAM 5 MG PO TABS
5.0000 mg | ORAL_TABLET | ORAL | Status: AC
  Administered 2015-02-18: 5 mg via ORAL

## 2015-02-18 MED ORDER — OXYCODONE HCL ER 20 MG PO T12A: EXTENDED_RELEASE_TABLET | ORAL | Status: DC

## 2015-02-18 MED ORDER — DIAZEPAM 5 MG PO TABS
ORAL_TABLET | ORAL | Status: AC
  Filled 2015-02-18: qty 1

## 2015-02-18 MED ORDER — BACITRACIN ZINC 500 UNIT/GM EX OINT: TOPICAL_OINTMENT | Freq: Two times a day (BID) | CUTANEOUS | Status: DC

## 2015-02-18 MED ORDER — PANTOPRAZOLE SODIUM 40 MG PO TBEC: 40.0000 mg | DELAYED_RELEASE_TABLET | Freq: Every day | ORAL | Status: DC

## 2015-02-18 MED ORDER — BETHANECHOL CHLORIDE 10 MG PO TABS: ORAL_TABLET | ORAL | Status: DC

## 2015-02-18 MED ORDER — DOCUSATE SODIUM 100 MG PO CAPS: 100.0000 mg | ORAL_CAPSULE | Freq: Two times a day (BID) | ORAL | Status: DC

## 2015-02-18 MED ORDER — METHOCARBAMOL 500 MG PO TABS: 500.0000 mg | ORAL_TABLET | Freq: Four times a day (QID) | ORAL | Status: DC | PRN

## 2015-02-18 MED ORDER — ASPIRIN 325 MG PO TBEC: 325.0000 mg | DELAYED_RELEASE_TABLET | Freq: Every day | ORAL | Status: DC

## 2015-02-18 MED ORDER — CLONIDINE HCL 0.1 MG PO TABS: 0.1000 mg | ORAL_TABLET | Freq: Two times a day (BID) | ORAL | Status: DC

## 2015-02-18 NOTE — Progress Notes (Signed)
Occupational Therapy Discharge Summary  Patient Details  Name: Gregory Ibarra MRN: [id number] Date of Birth: Sep 28, 1976  Today's Date: 02/18/2015  Patient has met 9 of 9 long term goals due to improved activity tolerance, improved balance, postural control, ability to compensate for deficits, functional use of  LEFT lower extremity and improved coordination.  Patient to discharge at overall Modified Independent level.  Patient's care partner is not independent and not necessary to provide the necessary physical and cognitive assistance at discharge as patient is discharging at modified independent level.    Reasons goals not met: N/A. All LTGs met.   Recommendation:  No skilled occupational therapy recommended at this time.   Equipment: BSC. Patient reporting his family will purchase shower chair upon discharge home  Reasons for discharge: treatment goals met and discharge from hospital  Patient/family agrees with progress made and goals achieved: Yes  OT Discharge Precautions/Restrictions  Precautions Precautions: None Restrictions Weight Bearing Restrictions: Yes LLE Weight Bearing: Weight bearing as tolerated General   Vital Signs Therapy Vitals Temp: 98.3 F (36.8 C) Temp Source: Oral Pulse Rate: 96 Resp: 18 BP: 125/82 mmHg Patient Position (if appropriate): Lying Oxygen Therapy SpO2: 98 % O2 Device: Not Delivered Pain Pain Assessment Pain Assessment: 0-10 Pain Score: 7  Faces Pain Scale: Hurts a little bit Pain Type: Surgical pain Pain Location: Leg Pain Orientation: Left Pain Intervention(s): Medication (See eMAR) ADL   Vision/Perception  Vision- History Baseline Vision/History: No visual deficits Patient Visual Report: No change from baseline Vision- Assessment Vision Assessment?: Yes Eye Alignment: Within Functional Limits Ocular Range of Motion: Within Functional Limits Tracking/Visual Pursuits: Able to track stimulus in all quads without  difficulty Saccades: Within functional limits Convergence: Within functional limits  Cognition Overall Cognitive Status: Within Functional Limits for tasks assessed Arousal/Alertness: Awake/alert Orientation Level: Oriented X4 Attention: Selective Selective Attention: Appears intact Memory: Appears intact Awareness: Appears intact Problem Solving: Appears intact Safety/Judgment: Appears intact Sensation Sensation Light Touch: Appears Intact Hot/Cold: Appears Intact Coordination Gross Motor Movements are Fluid and Coordinated: No Fine Motor Movements are Fluid and Coordinated: Yes Coordination and Movement Description: limited by pain in LLE Motor  Motor Motor: Within Functional Limits Motor - Discharge Observations: limited by pain/swelling in LLE Mobility  Bed Mobility Bed Mobility: Sit to Supine;Supine to Sit Supine to Sit: HOB flat;6: Modified independent (Device/Increase time) Sit to Supine: 6: Modified independent (Device/Increase time);HOB flat Transfers Transfers: Sit to Stand;Stand to Sit Sit to Stand: 6: Modified independent (Device/Increase time) Stand to Sit: 6: Modified independent (Device/Increase time)  Trunk/Postural Assessment  Cervical Assessment Cervical Assessment: Within Functional Limits Thoracic Assessment Thoracic Assessment: Within Functional Limits Lumbar Assessment Lumbar Assessment: Within Functional Limits  Balance Balance Balance Assessed: Yes Static Standing Balance Static Standing - Balance Support: During functional activity;Bilateral upper extremity supported Static Standing - Level of Assistance: 6: Modified independent (Device/Increase time) Dynamic Standing Balance Dynamic Standing - Balance Support: Bilateral upper extremity supported;During functional activity Dynamic Standing - Level of Assistance: 6: Modified independent (Device/Increase time) Extremity/Trunk Assessment RUE Assessment RUE Assessment: Within Functional Limits  (5/5 strength) LUE Assessment LUE Assessment: Within Functional Limits (5/5 strength)  See FIM for current functional status  Gregory Ibarra N 02/18/2015, 7:51 AM
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
## 2015-02-18 NOTE — Progress Notes (Signed)
Patient discharged home.  Left floor via wheelchair, escorted by nursing staff and family.  All patient belongings sent with patient including DME.  Patient verbalized understanding of discharge instructions as given by Algis Liming, PA.  Appears to be in no immediate distress at this time.  Brita Romp, RN

## 2015-02-18 NOTE — Progress Notes (Signed)
Social Work Discharge Note  The overall goal for the admission was met for:   Discharge location: Yes- home with girlfriend who can provide 24/7 assistance  Length of Stay: Yes - 6 days  Discharge activity level: Yes -modified independent  Home/community participation: Yes  Services provided included: MD, RD, PT, OT, RN, TR, Pharmacy and SW  Financial Services: Other: NONE  Follow-up services arranged: [facility], DME: 20x18 lightweight w/c with ELRs, cushion, rolling walker and 3n1 commode all via [place], Other: referred pt to the [facility] program for medication assistance and to [facility] for primary care services and Patient/Family has no preference for HH/DME agencies  Comments (or additional information):  Patient/Family verbalized understanding of follow-up arrangements: Yes  Individual responsible for coordination of the follow-up plan: pt  Confirmed correct DME delivered: Gregory Ibarra 02/18/2015    Gregory Ibarra

## 2015-02-18 NOTE — Discharge Instructions (Signed)
Inpatient Rehab Discharge Instructions  Gregory Ibarra Discharge date and time: 02/18/15   Activities/Precautions/ Functional Status: Activity: activity as tolerated Diet: regular diet Wound Care: Wash with soap and water. Keep wound clean and dry   Functional status:  ___ No restrictions     ___ Walk up steps independently ___ 24/7 supervision/assistance   ___ Walk up steps with assistance _X__ Intermittent supervision/assistance  _X__ Bathe/dress independently ___ Walk with walker     ___ Bathe/dress with assistance ___ Walk Independently    ___ Shower independently __x_ Walk with assistance    ___ Shower with assistance _X__ No alcohol     ___ Return to work/school ________    COMMUNITY REFERRALS UPON DISCHARGE:    Home Health:   PT     RN                     Agency: [place] Phone: [phone]   Medical Equipment/Items Ordered: wheelchair, cushion, rolling walker and commode                                                      Agency/Supplier:  [facility] @ [phone]       Special Instructions: No driving   My questions have been answered and I understand these instructions. I will adhere to these goals and the provided educational materials after my discharge from the hospital.  Patient/Caregiver Signature _______________________________ Date __________  Clinician Signature _______________________________________ Date __________  Please bring this form and your medication list with you to all your follow-up doctor's appointments.

## 2015-02-19 ENCOUNTER — Encounter: Payer: Self-pay | Admitting: Family Medicine

## 2015-02-19 ENCOUNTER — Ambulatory Visit: Payer: 59 | Attending: Family Medicine | Admitting: Family Medicine

## 2015-02-19 VITALS — BP 108/69 | HR 92 | Temp 98.2°F | Resp 18 | Ht 71.0 in | Wt 214.6 lb

## 2015-02-19 DIAGNOSIS — S81811A Laceration without foreign body, right lower leg, initial encounter: Secondary | ICD-10-CM

## 2015-02-19 DIAGNOSIS — S72362S Displaced segmental fracture of shaft of left femur, sequela: Secondary | ICD-10-CM | POA: Diagnosis not present

## 2015-02-19 DIAGNOSIS — K5909 Other constipation: Secondary | ICD-10-CM

## 2015-02-19 DIAGNOSIS — M62838 Other muscle spasm: Secondary | ICD-10-CM | POA: Insufficient documentation

## 2015-02-19 DIAGNOSIS — S72302D Unspecified fracture of shaft of left femur, subsequent encounter for closed fracture with routine healing: Secondary | ICD-10-CM | POA: Diagnosis not present

## 2015-02-19 DIAGNOSIS — K5900 Constipation, unspecified: Secondary | ICD-10-CM | POA: Insufficient documentation

## 2015-02-19 MED ORDER — LACTULOSE 10 GM/15ML PO SOLN
10.0000 g | Freq: Three times a day (TID) | ORAL | Status: DC
Start: 2015-02-19 — End: 2015-03-03

## 2015-02-19 NOTE — Patient Instructions (Signed)
About Constipation  Constipation Overview Constipation is the most common gastrointestinal complaint - about 4 million Americans experience constipation and make 2.5 million physician visits a year to get help for the problem.  Constipation can occur when the colon absorbs too much water, the colon's muscle contraction is slow or sluggish, and/or there is delayed transit time through the colon.  The result is stool that is hard and dry.  Indicators of constipation include straining during bowel movements greater than 25% of the time, having fewer than three bowel movements per week, and/or the feeling of incomplete evacuation.  There are established guidelines ([place] II ) for defining constipation. A person needs to have two or more of the following symptoms for at least 12 weeks (not necessarily consecutive) in the preceding 12 months: . Straining in  greater than 25% of bowel movements . Lumpy or hard stools in greater than 25% of bowel movements . Sensation of incomplete emptying in greater than 25% of bowel movements . Sensation of anorectal obstruction/blockade in greater than 25% of bowel movements . Manual maneuvers to help empty greater than 25% of bowel movements (e.g., digital evacuation, support of the pelvic floor)  . Less than  3 bowel movements/week . Loose stools are not present, and criteria for irritable bowel syndrome are insufficient  Common Causes of Constipation . Lack of fiber in your diet . Lack of physical activity . Medications, including iron and calcium supplements  . Dairy intake . Dehydration . Abuse of laxatives  Travel  Irritable Bowel Syndrome  Pregnancy  Luteal phase of menstruation (after ovulation and before menses)  Colorectal problems  Intestinal Dysfunction  Treating Constipation  There are several ways of treating constipation, including changes to diet and exercise, use of laxatives, adjustments to the pelvic floor, and scheduled toileting.   These treatments include: . increasing fiber and fluids in the diet  . increasing physical activity . learning muscle coordination   learning proper toileting techniques and toileting modifications   designing and sticking  to a toileting schedule     2007, [employer] Doc.22
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
## 2015-02-25 ENCOUNTER — Telehealth: Payer: Self-pay | Admitting: General Practice

## 2015-02-25 NOTE — Telephone Encounter (Signed)
Nurse from [facility] calling to clarify INR lab order.  Please f/u with nurse.

## 2015-02-27 ENCOUNTER — Emergency Department (HOSPITAL_COMMUNITY)
Admission: EM | Admit: 2015-02-27 | Discharge: 2015-02-27 | Disposition: A | Discharge status: Home / Self Care | Payer: 59 | Attending: Emergency Medicine | Admitting: Emergency Medicine

## 2015-02-27 ENCOUNTER — Encounter (HOSPITAL_COMMUNITY): Payer: Self-pay

## 2015-02-27 DIAGNOSIS — R2242 Localized swelling, mass and lump, left lower limb: Secondary | ICD-10-CM | POA: Insufficient documentation

## 2015-02-27 DIAGNOSIS — Z792 Long term (current) use of antibiotics: Secondary | ICD-10-CM | POA: Insufficient documentation

## 2015-02-27 DIAGNOSIS — Z79899 Other long term (current) drug therapy: Secondary | ICD-10-CM | POA: Insufficient documentation

## 2015-02-27 DIAGNOSIS — M419 Scoliosis, unspecified: Secondary | ICD-10-CM | POA: Insufficient documentation

## 2015-02-27 DIAGNOSIS — M62838 Other muscle spasm: Secondary | ICD-10-CM

## 2015-02-27 DIAGNOSIS — Z7982 Long term (current) use of aspirin: Secondary | ICD-10-CM | POA: Insufficient documentation

## 2015-02-27 DIAGNOSIS — J45909 Unspecified asthma, uncomplicated: Secondary | ICD-10-CM | POA: Insufficient documentation

## 2015-02-27 DIAGNOSIS — Z720 Tobacco use: Secondary | ICD-10-CM | POA: Insufficient documentation

## 2015-02-27 DIAGNOSIS — M7989 Other specified soft tissue disorders: Secondary | ICD-10-CM

## 2015-02-27 DIAGNOSIS — M25552 Pain in left hip: Secondary | ICD-10-CM | POA: Insufficient documentation

## 2015-02-27 DIAGNOSIS — M79605 Pain in left leg: Secondary | ICD-10-CM

## 2015-02-27 DIAGNOSIS — G43909 Migraine, unspecified, not intractable, without status migrainosus: Secondary | ICD-10-CM | POA: Insufficient documentation

## 2015-02-27 MED ORDER — DIAZEPAM 5 MG PO TABS
5.0000 mg | ORAL_TABLET | Freq: Once | ORAL | Status: AC
  Administered 2015-02-27: 5 mg via ORAL
  Filled 2015-02-27: qty 1

## 2015-02-27 MED ORDER — DIAZEPAM 5 MG PO TABS: 5.0000 mg | ORAL_TABLET | Freq: Three times a day (TID) | ORAL | Status: DC | PRN

## 2015-02-27 MED ORDER — OXYCODONE HCL ER 10 MG PO T12A
20.0000 mg | EXTENDED_RELEASE_TABLET | Freq: Once | ORAL | Status: AC
Start: 2015-02-27 — End: 2015-02-27
  Administered 2015-02-27: 20 mg via ORAL
  Filled 2015-02-27: qty 2

## 2015-02-27 MED ORDER — ENOXAPARIN SODIUM 100 MG/ML ~~LOC~~ SOLN
100.0000 mg | Freq: Once | SUBCUTANEOUS | Status: AC
  Administered 2015-02-27: 100 mg via SUBCUTANEOUS
  Filled 2015-02-27: qty 1

## 2015-02-27 NOTE — ED Provider Notes (Signed)
CSN: [id number]     Arrival date & time 02/27/15  2039 History   First MD Initiated Contact with Patient 02/27/15 2058     Chief Complaint  Patient presents with  . Leg Swelling     (Consider location/radiation/quality/duration/timing/severity/associated sxs/prior Treatment) HPI Comments: Atticus Wedin is a 39 y.o. male with a PMHx of scoliosis, asthma, migraines, spinal stenosis, and recent L femur fx s/p ORIF with nail placement by Dr. Erlinda Hong, who presents to the ED with complaints of left inner thigh tightness and cramps that radiates slightly down the leg, and into his gluteus region. He states his symptoms began after his Ace bandage was removed on 02/23/15 and have gradually worsened, stating the pain is 9/10 constant pain worse with sitting and lying, and improved with standing. He reports that he has had some swelling in his leg which has improved overall since he was discharged from the hospital. He states the area feels slightly warm to touch, but denies any erythema. Denies any drainage from his wounds, or diminished range of motion from his baseline. His last physical therapy appointment was yesterday. He states he has been taking Percocet, and muscle relaxers with some relief of his pain, but today he did not take his 8 PM dose of OxyContin 20 mg XR. He denies any fevers, chills, chest pain, shortness breath, abdominal pain, nausea, vomiting, diarrhea, constipation, dysuria, hematuria, testicular pain or swelling, numbness, tingling, weakness, or joint swelling. His next appt with Dr. Erlinda Hong is 6wks from now. He is not on any abx.  Chart review reveals that he has swelling in the hospital to this leg, had a neg DVT study, and was discontinued on lovenox and started on ASA 381m daily which he has been compliant with.   Patient is a 39y.o. male presenting with leg pain. The history is provided by the patient. No language interpreter was used.  Leg Pain Location:  Hip, buttock and leg Time since  incident:  4 days Hip location:  L hip Buttock location:  L buttock Leg location:  L upper leg and L lower leg Pain details:    Quality:  Cramping (and tightness)   Radiates to:  Does not radiate   Severity:  Severe   Onset quality:  Gradual   Duration:  4 days   Timing:  Constant   Progression:  Unchanged Chronicity:  Recurrent Tetanus status:  Up to date Prior injury to area:  Yes Relieved by: walking. Exacerbated by: laying down or sitting. Ineffective treatments:  None tried Associated symptoms: swelling (improving since discharge)   Associated symptoms: no back pain, no decreased ROM, no fever, no muscle weakness, no numbness, no stiffness and no tingling     Past Medical History  Diagnosis Date  . Asthma   . Migraine   . Spinal stenosis   . Scoliosis   . Seasonal allergies    Past Surgical History  Procedure Laterality Date  . Cosmetic surgery    . Femur fracture surgery Left 02/08/2015  . Femur im nail Left 02/08/2015    Procedure: INTRAMEDULLARY (IM) NAIL FEMORAL;  Surgeon: NLeandrew Koyanagi MD;  [place] [facility]  Service: Orthopedics;  Laterality: Left;  . Incision and drainage of wound Right 02/08/2015    Procedure: IRRIGATION AND DEBRIDEMENT OF RIGHT LOWER LEG WOUND;  Surgeon: NLeandrew Koyanagi MD;  [place] [facility]  Service: Orthopedics;  Laterality: Right;   Family History  Problem Relation Age of Onset  . Hypertension Mother   .  Asthma Mother    History  Substance Use Topics  . Smoking status: Current Every Day Smoker -- 1.00 packs/day    Types: Cigarettes  . Smokeless tobacco: Never Used  . Alcohol Use: 2.4 oz/week    4 Shots of liquor per week     Comment: occasional    Review of Systems  Constitutional: Negative for fever and chills.  Respiratory: Negative for shortness of breath.   Cardiovascular: Positive for leg swelling (L leg, improved since discharge). Negative for chest pain.  Gastrointestinal: Negative for nausea, vomiting, abdominal pain,  diarrhea and constipation.  Genitourinary: Negative for dysuria, hematuria, discharge, scrotal swelling, penile pain and testicular pain.  Musculoskeletal: Positive for myalgias (L thigh/gluteus) and arthralgias (L hip). Negative for back pain, joint swelling and stiffness.  Skin: Negative for color change.  Allergic/Immunologic: Negative for immunocompromised state.  Neurological: Negative for weakness and numbness.  Psychiatric/Behavioral: Negative for confusion.   10 Systems reviewed and are negative for acute change except as noted in the HPI.    Allergies  Sulfa antibiotics  Home Medications   Prior to Admission medications   Medication Sig Start Date End Date Taking? Authorizing Provider  acetaminophen (TYLENOL) 500 MG tablet Take 500-1,000 mg by mouth every 6 (six) hours as needed for pain.    Historical Provider, MD  aspirin EC 325 MG EC tablet Take 1 tablet (325 mg total) by mouth daily. 02/18/15   Ivan Anchors Love, PA-C  bacitracin ointment Apply topically 2 (two) times daily. 02/18/15   Ivan Anchors Love, PA-C  bethanechol (URECHOLINE) 10 MG tablet Use one pill three times a day for 3 days. Then decrease to twice a day for three days. Then use daily till gone. Patient not taking: Reported on 02/19/2015 02/18/15   Ivan Anchors Love, PA-C  cloNIDine (CATAPRES) 0.1 MG tablet Take 1 tablet (0.1 mg total) by mouth 2 (two) times daily. 02/18/15   Bary Leriche, PA-C  docusate sodium (COLACE) 100 MG capsule Take 1 capsule (100 mg total) by mouth 2 (two) times daily. 02/18/15   Bary Leriche, PA-C  lactulose (CHRONULAC) 10 GM/15ML solution Take 15 mLs (10 g total) by mouth 3 (three) times daily. 02/19/15   Arnoldo Morale, MD  methocarbamol (ROBAXIN) 500 MG tablet Take 1 tablet (500 mg total) by mouth every 6 (six) hours as needed for muscle spasms. 02/18/15   Bary Leriche, PA-C  OxyCODONE (OXYCONTIN) 20 mg T12A 12 hr tablet Use one pill twice a day at 8 am and 8 pm for two weeks. Then decrease to once  daily till gone. 02/18/15   Bary Leriche, PA-C  Oxycodone HCl 20 MG TABS Take 1/2 to one pill every 6 hours as needed for severe pain. 02/18/15   Bary Leriche, PA-C  pantoprazole (PROTONIX) 40 MG tablet Take 1 tablet (40 mg total) by mouth daily. 02/18/15   Bary Leriche, PA-C  tamsulosin (FLOMAX) 0.4 MG CAPS capsule Take 2 capsules (0.8 mg total) by mouth daily. 02/18/15   Ivan Anchors Love, PA-C   BP 131/65 mmHg  Pulse 92  Temp(Src) 98.7 F (37.1 C) (Oral)  Resp 20  SpO2 100% Physical Exam  Constitutional: He is oriented to person, place, and time. Vital signs are normal. He appears well-developed and well-nourished.  Non-toxic appearance. He appears distressed (uncomfortable).  Afebrile, nontoxic, appears uncomfortable, standing next to bed  HENT:  Head: Normocephalic and atraumatic.  Mouth/Throat: Oropharynx is clear and moist and mucous membranes are  normal.  Eyes: Conjunctivae and EOM are normal. Right eye exhibits no discharge. Left eye exhibits no discharge.  Neck: Normal range of motion. Neck supple.  Cardiovascular: Normal rate, regular rhythm, normal heart sounds and intact distal pulses.  Exam reveals no gallop and no friction rub.   No murmur heard. RRR, nl s1/s2, no m/r/g, distal pulses intact, trace L leg swelling diffusely to entire leg  Pulmonary/Chest: Effort normal and breath sounds normal. No respiratory distress. He has no decreased breath sounds. He has no wheezes. He has no rhonchi. He has no rales.  CTAB in all lung fields, no w/r/r, no hypoxia or increased WOB, speaking in full sentences, SpO2 100% on RA   Abdominal: Soft. Normal appearance and bowel sounds are normal. He exhibits no distension. There is no tenderness. There is no rigidity, no rebound, no guarding, no CVA tenderness, no tenderness at McBurney's point and negative Murphy's sign.  Musculoskeletal:       Left hip: He exhibits decreased range of motion (due to post op pain, at baseline per pt). He exhibits  normal strength, no tenderness, no bony tenderness and no swelling.       Left upper leg: He exhibits tenderness and swelling.       Legs: L hip with limited ROM due to post-op pain, but at baseline per pt. L inner thigh with mild erythema, no warmth, mild swelling which pt states is improved from previously, with diffuse tenderness in all areas of L leg. Compartments soft. Surgical incision to lateral thigh and L knee well healing without drainage. Distal pulses intact, sensation grossly intact, strength with dorsiflexion/plantarflexion intact bilaterally. Gait mildly antalgic but steady.   Neurological: He is alert and oriented to person, place, and time. He has normal strength. No sensory deficit. Gait (antalgic) abnormal.  Skin: Skin is warm and dry. No rash noted. There is erythema.  L thigh with mild erythema and swelling as noted above  Psychiatric: He has a normal mood and affect.  Nursing note and vitals reviewed.   ED Course  Procedures (including critical care time) Labs Review Labs Reviewed - No data to display  Imaging Review No results found.   EKG Interpretation None      MDM   Final diagnoses:  Left leg pain  Muscle spasm of left lower extremity  Left leg swelling    39 y.o. male here with L thigh pain, tightness, and cramps that have been ongoing since he left the hospital and had his ace bandage removed on 02/23/15. Denies increased swelling, states it's actually going down. Denies erythema or decreased ROM in hip. No drainage from wounds. On exam, L hip with limited ROM due to pain but pt states this is baseline after surgery, no warmth, mildly erythematous to inner thigh, mild swelling noted, TTP over entire leg, wounds without drainage. Neurovascularly intact with soft compartments. Doubt compartment syndrome. No hypoxia or tachycardia. Likely just post op swelling but given concern for possible DVT, will likely treat with lovenox and have him return tomorrow for  U/S.   11:03 PM Dr. Reather Converse saw pt and agrees with plan. Will also give valium here for spasms, and supply for home. Discussed proper use of this, and discontinuing his robaxin. Will have him come tomorrow for U/S. Instructed on strict return precautions. Will have him see surgeon on Monday. Discussed elevation. I explained the diagnosis and have given explicit precautions to return to the ER including for any other new or worsening symptoms.  The patient understands and accepts the medical plan as it's been dictated and I have answered their questions. Discharge instructions concerning home care and prescriptions have been given. The patient is STABLE and is discharged to home in good condition.  BP 126/80 mmHg  Pulse 96  Temp(Src) 98.7 F (37.1 C) (Oral)  Resp 13  SpO2 100%  Meds ordered this encounter  Medications  . OxyCODONE (OXYCONTIN) 12 hr tablet 20 mg    Sig:   . enoxaparin (LOVENOX) injection 100 mg    Sig:   . diazepam (VALIUM) tablet 5 mg    Sig:   . diazepam (VALIUM) 5 MG tablet    Sig: Take 1 tablet (5 mg total) by mouth every 8 (eight) hours as needed for muscle spasms (spasms).    Dispense:  15 tablet    Refill:  0    Order Specific Question:  Supervising Provider    Answer:  Noemi Chapel [[id number]]     Andrej Spagnoli Camprubi-Soms, PA-C 02/27/15 [id number]  Elnora Morrison, MD 02/28/15 [id number]

## 2015-02-27 NOTE — Discharge Instructions (Signed)
Follow up with the ultrasound department tomorrow for an ultrasound of your leg to evaluate for a DVT. Take valium instead of robaxin for your muscle spasms. Elevate your leg. Use heat over the areas of pain for 20 minutes at a time every hour. Use your home pain medications. Call your surgeon on Monday for follow up visit. Return to the ER for changes or worsening symptoms.   Edema Edema is an abnormal buildup of fluids. It is more common in your legs and thighs. Painless swelling of the feet and ankles is more likely as a person ages. It also is common in looser skin, like around your eyes. HOME CARE   Keep the affected body part above the level of the heart while lying down.  Do not sit still or stand for a long time.  Do not put anything right under your knees when you lie down.  Do not wear tight clothes on your upper legs.  Exercise your legs to help the puffiness (swelling) go down.  Wear elastic bandages or support stockings as told by your doctor.  A low-salt diet may help lessen the puffiness.  Only take medicine as told by your doctor. GET HELP IF:  Treatment is not working.  You have heart, liver, or kidney disease and notice that your skin looks puffy or shiny.  You have puffiness in your legs that does not get better when you raise your legs.  You have sudden weight gain for no reason. GET HELP RIGHT AWAY IF:   You have shortness of breath or chest pain.  You cannot breathe when you lie down.  You have pain, redness, or warmth in the areas that are puffy.  You have heart, liver, or kidney disease and get edema all of a sudden.  You have a fever and your symptoms get worse all of a sudden. MAKE SURE YOU:   Understand these instructions.  Will watch your condition.  Will get help right away if you are not doing well or get worse. Document Released: 04/25/2008 Document Revised: 11/12/2013 Document Reviewed: 08/30/2013 [facility] Patient Information 2015  [place], [place]. This information is not intended to replace advice given to you by your health care provider. Make sure you discuss any questions you have with your health care provider.  Heat Therapy Heat therapy can help ease sore, stiff, injured, and tight muscles and joints. Heat relaxes your muscles, which may help ease your pain.  RISKS AND COMPLICATIONS If you have any of the following conditions, do not use heat therapy unless your health care provider has approved:  Poor circulation.  Healing wounds or scarred skin in the area being treated.  Diabetes, heart disease, or high blood pressure.  Not being able to feel (numbness) the area being treated.  Unusual swelling of the area being treated.  Active infections.  Blood clots.  Cancer.  Inability to communicate pain. This may include young children and people who have problems with their brain function (dementia).  Pregnancy. Heat therapy should only be used on old, pre-existing, or long-lasting (chronic) injuries. Do not use heat therapy on new injuries unless directed by your health care provider. HOW TO USE HEAT THERAPY There are several different kinds of heat therapy, including:  Moist heat pack.  Warm water bath.  Hot water bottle.  Electric heating pad.  Heated gel pack.  Heated wrap.  Electric heating pad. Use the heat therapy method suggested by your health care provider. Follow your health care provider's  instructions on when and how to use heat therapy. GENERAL HEAT THERAPY RECOMMENDATIONS  Do not sleep while using heat therapy. Only use heat therapy while you are awake.  Your skin may turn pink while using heat therapy. Do not use heat therapy if your skin turns red.  Do not use heat therapy if you have new pain.  High heat or long exposure to heat can cause burns. Be careful when using heat therapy to avoid burning your skin.  Do not use heat therapy on areas of your skin that are already  irritated, such as with a rash or sunburn. SEEK MEDICAL CARE IF:  You have blisters, redness, swelling, or numbness.  You have new pain.  Your pain is worse. MAKE SURE YOU:  Understand these instructions.  Will watch your condition.  Will get help right away if you are not doing well or get worse. Document Released: 01/30/2012 Document Revised: 03/24/2014 Document Reviewed: 12/31/2013 [facility] Patient Information 2015 [place], [place]. This information is not intended to replace advice given to you by your health care provider. Make sure you discuss any questions you have with your health care provider.  Muscle Cramps and Spasms Muscle cramps and spasms occur when a muscle or muscles tighten and you have no control over this tightening (involuntary muscle contraction). They are a common problem and can develop in any muscle. The most common place is in the calf muscles of the leg. Both muscle cramps and muscle spasms are involuntary muscle contractions, but they also have differences:   Muscle cramps are sporadic and painful. They may last a few seconds to a quarter of an hour. Muscle cramps are often more forceful and last longer than muscle spasms.  Muscle spasms may or may not be painful. They may also last just a few seconds or much longer. CAUSES  It is uncommon for cramps or spasms to be due to a serious underlying problem. In many cases, the cause of cramps or spasms is unknown. Some common causes are:   Overexertion.   Overuse from repetitive motions (doing the same thing over and over).   Remaining in a certain position for a long period of time.   Improper preparation, form, or technique while performing a sport or activity.   Dehydration.   Injury.   Side effects of some medicines.   Abnormally low levels of the salts and ions in your blood (electrolytes), especially potassium and calcium. This could happen if you are taking water pills (diuretics) or you are  pregnant.  Some underlying medical problems can make it more likely to develop cramps or spasms. These include, but are not limited to:   Diabetes.   Parkinson disease.   Hormone disorders, such as thyroid problems.   Alcohol abuse.   Diseases specific to muscles, joints, and bones.   Blood vessel disease where not enough blood is getting to the muscles.  HOME CARE INSTRUCTIONS   Stay well hydrated. Drink enough water and fluids to keep your urine clear or pale yellow.  It may be helpful to massage, stretch, and relax the affected muscle.  For tight or tense muscles, use a warm towel, heating pad, or hot shower water directed to the affected area.  If you are sore or have pain after a cramp or spasm, applying ice to the affected area may relieve discomfort.  Put ice in a plastic bag.  Place a towel between your skin and the bag.  Leave the ice on for  15-20 minutes, 03-04 times a day.  Medicines used to treat a known cause of cramps or spasms may help reduce their frequency or severity. Only take over-the-counter or prescription medicines as directed by your caregiver. SEEK MEDICAL CARE IF:  Your cramps or spasms get more severe, more frequent, or do not improve over time.  MAKE SURE YOU:   Understand these instructions.  Will watch your condition.  Will get help right away if you are not doing well or get worse. Document Released: 04/29/2002 Document Revised: 03/04/2013 Document Reviewed: 10/24/2012 [facility] Patient Information 2015 [place], [place]. This information is not intended to replace advice given to you by your health care provider. Make sure you discuss any questions you have with your health care provider.  Musculoskeletal Pain Musculoskeletal pain is muscle and boney aches and pains. These pains can occur in any part of the body. Your caregiver may treat you without knowing the cause of the pain. They may treat you if blood or urine tests, X-rays, and  other tests were normal.  CAUSES There is often not a definite cause or reason for these pains. These pains may be caused by a type of germ (virus). The discomfort may also come from overuse. Overuse includes working out too hard when your body is not fit. Boney aches also come from weather changes. Bone is sensitive to atmospheric pressure changes. HOME CARE INSTRUCTIONS   Ask when your test results will be ready. Make sure you get your test results.  Only take over-the-counter or prescription medicines for pain, discomfort, or fever as directed by your caregiver. If you were given medications for your condition, do not drive, operate machinery or power tools, or sign legal documents for 24 hours. Do not drink alcohol. Do not take sleeping pills or other medications that may interfere with treatment.  Continue all activities unless the activities cause more pain. When the pain lessens, slowly resume normal activities. Gradually increase the intensity and duration of the activities or exercise.  During periods of severe pain, bed rest may be helpful. Lay or sit in any position that is comfortable.  Putting ice on the injured area.  Put ice in a bag.  Place a towel between your skin and the bag.  Leave the ice on for 15 to 20 minutes, 3 to 4 times a day.  Follow up with your caregiver for continued problems and no reason can be found for the pain. If the pain becomes worse or does not go away, it may be necessary to repeat tests or do additional testing. Your caregiver may need to look further for a possible cause. SEEK IMMEDIATE MEDICAL CARE IF:  You have pain that is getting worse and is not relieved by medications.  You develop chest pain that is associated with shortness or breath, sweating, feeling sick to your stomach (nauseous), or throw up (vomit).  Your pain becomes localized to the abdomen.  You develop any new symptoms that seem different or that concern you. MAKE SURE YOU:     Understand these instructions.  Will watch your condition.  Will get help right away if you are not doing well or get worse. Document Released: 11/07/2005 Document Revised: 01/30/2012 Document Reviewed: 07/12/2013 [facility] Patient Information 2015 [place], [place]. This information is not intended to replace advice given to you by your health care provider. Make sure you discuss any questions you have with your health care provider.
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
## 2015-02-28 ENCOUNTER — Ambulatory Visit (HOSPITAL_COMMUNITY)
Admission: RE | Admit: 2015-02-28 | Discharge: 2015-02-28 | Disposition: A | Discharge status: Home / Self Care | Payer: 59 | Source: Ambulatory Visit | Attending: Physician Assistant | Admitting: Physician Assistant

## 2015-02-28 DIAGNOSIS — M79609 Pain in unspecified limb: Secondary | ICD-10-CM | POA: Diagnosis not present

## 2015-02-28 DIAGNOSIS — M79606 Pain in leg, unspecified: Secondary | ICD-10-CM | POA: Insufficient documentation

## 2015-02-28 NOTE — Progress Notes (Addendum)
*  PRELIMINARY RESULTS* Vascular Ultrasound Left lower extremity venous duplex has been completed.  Preliminary findings: No evidence of DVT in visualized veins.  Previous study 02/13/15 was also negative.    Landry Mellow, RDMS, RVT  02/28/2015, 12:16 PM

## 2015-03-02 ENCOUNTER — Telehealth: Payer: Self-pay | Admitting: *Deleted

## 2015-03-02 NOTE — Telephone Encounter (Signed)
Betty called and said he did not get enough pain medication.  He needs an appointment asap. I have called and left a message for him to call back.  Dr Naaman Plummer has something tomorrow so I called again and left a message to please call office first thing in the am if he can make an appointment tomorrow.  Otherwise there will be nothing in the near future.

## 2015-03-03 ENCOUNTER — Other Ambulatory Visit: Payer: Self-pay | Admitting: Physical Medicine & Rehabilitation

## 2015-03-03 ENCOUNTER — Encounter: Payer: Self-pay | Admitting: Physical Medicine & Rehabilitation

## 2015-03-03 ENCOUNTER — Telehealth: Payer: Self-pay | Admitting: *Deleted

## 2015-03-03 ENCOUNTER — Encounter: Payer: 59 | Attending: Physical Medicine & Rehabilitation | Admitting: Physical Medicine & Rehabilitation

## 2015-03-03 VITALS — BP 122/58 | HR 95 | Resp 14

## 2015-03-03 DIAGNOSIS — Z5181 Encounter for therapeutic drug level monitoring: Secondary | ICD-10-CM

## 2015-03-03 DIAGNOSIS — S7012XS Contusion of left thigh, sequela: Secondary | ICD-10-CM

## 2015-03-03 DIAGNOSIS — G894 Chronic pain syndrome: Secondary | ICD-10-CM | POA: Diagnosis present

## 2015-03-03 DIAGNOSIS — D62 Acute posthemorrhagic anemia: Secondary | ICD-10-CM

## 2015-03-03 DIAGNOSIS — Z79899 Other long term (current) drug therapy: Secondary | ICD-10-CM

## 2015-03-03 DIAGNOSIS — S72362S Displaced segmental fracture of shaft of left femur, sequela: Secondary | ICD-10-CM

## 2015-03-03 DIAGNOSIS — S7012XA Contusion of left thigh, initial encounter: Secondary | ICD-10-CM | POA: Insufficient documentation

## 2015-03-03 LAB — CBC
HCT: 29.3 % — ABNORMAL LOW (ref 39.0–52.0)
Hemoglobin: 9.7 g/dL — ABNORMAL LOW (ref 13.0–17.0)
MCH: 28.5 pg (ref 26.0–34.0)
MCHC: 33.1 g/dL (ref 30.0–36.0)
MCV: 86.2 fL (ref 78.0–100.0)
MPV: 8.9 fL (ref 8.6–12.4)
PLATELETS: 397 10*3/uL (ref 150–400)
RBC: 3.4 MIL/uL — ABNORMAL LOW (ref 4.22–5.81)
RDW: 14.8 % (ref 11.5–15.5)
WBC: 7.2 10*3/uL (ref 4.0–10.5)

## 2015-03-03 MED ORDER — OXYCODONE HCL 10 MG PO TABS: 10.0000 mg | ORAL_TABLET | Freq: Four times a day (QID) | ORAL | Status: DC | PRN

## 2015-03-03 MED ORDER — DIAZEPAM 5 MG PO TABS: 5.0000 mg | ORAL_TABLET | Freq: Two times a day (BID) | ORAL | Status: DC

## 2015-03-03 MED ORDER — OXYCODONE HCL ER 20 MG PO T12A: 20.0000 mg | EXTENDED_RELEASE_TABLET | Freq: Two times a day (BID) | ORAL | Status: DC

## 2015-03-03 NOTE — Telephone Encounter (Signed)
Prio auth initiated on Oxycontin with [employer] via Cover my Meds
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
## 2015-03-03 NOTE — Patient Instructions (Signed)
PLEASE CALL ME WITH ANY PROBLEMS OR QUESTIONS (#[phone]).     Purchase a Carpal Tunnel Splint to wear at night when you sleep.

## 2015-03-03 NOTE — Telephone Encounter (Signed)
Gregory Ibarra is coming in today.

## 2015-03-04 NOTE — Telephone Encounter (Signed)
I will let him know about hgb.  We are not overseeing INR (Dr Naaman Plummer attached his message to a previous call from another clinic)

## 2015-03-04 NOTE — Telephone Encounter (Signed)
Let patient know his hgb is climbing  (9.7)  thx

## 2015-03-06 LAB — PMP ALCOHOL METABOLITE (ETG): Ethyl Glucuronide (EtG): NEGATIVE ng/mL

## 2015-03-11 ENCOUNTER — Telehealth: Payer: Self-pay | Admitting: *Deleted

## 2015-03-11 LAB — OXYCODONE, URINE (LC/MS-MS)
Noroxycodone, Ur: 23309 ng/mL (ref <50)
OXYCODONE, UR: 11931 ng/mL (ref <50)
OXYMORPHONE, URINE: 28140 ng/mL (ref <50)

## 2015-03-11 LAB — BENZODIAZEPINES (GC/LC/MS), URINE
ALPRAZOLAMU: NEGATIVE ng/mL (ref <25)
Clonazepam metabolite (GC/LC/MS), ur confirm: NEGATIVE ng/mL (ref <25)
Flurazepam metabolite (GC/LC/MS), ur confirm: NEGATIVE ng/mL (ref <50)
Lorazepam (GC/LC/MS), ur confirm: NEGATIVE ng/mL (ref <50)
Midazolam (GC/LC/MS), ur confirm: NEGATIVE ng/mL (ref <50)
Nordiazepam (GC/LC/MS), ur confirm: 1750 ng/mL — AB (ref <50)
Oxazepam (GC/LC/MS), ur confirm: 1550 ng/mL — AB (ref <50)
TEMAZEPAMU: 3270 ng/mL — AB (ref <50)
Triazolam metabolite (GC/LC/MS), ur confirm: NEGATIVE ng/mL (ref <50)

## 2015-03-11 LAB — OPIATES/OPIOIDS (LC/MS-MS)
Codeine Urine: NEGATIVE ng/mL (ref <50)
HYDROMORPHONE: NEGATIVE ng/mL (ref <50)
Hydrocodone: NEGATIVE ng/mL (ref <50)
Morphine Urine: NEGATIVE ng/mL (ref <50)
NORHYDROCODONE, UR: NEGATIVE ng/mL (ref <50)
Noroxycodone, Ur: 23309 ng/mL (ref <50)
OXYCODONE, UR: 11931 ng/mL (ref <50)
Oxymorphone: 28140 ng/mL (ref <50)

## 2015-03-11 MED ORDER — MORPHINE SULFATE ER 30 MG PO TBCR: 30.0000 mg | EXTENDED_RELEASE_TABLET | Freq: Two times a day (BID) | ORAL | Status: DC

## 2015-03-11 NOTE — Addendum Note (Signed)
Addended by: Geryl Rankins D on: 03/11/2015 10:56 AM   Modules accepted: Orders

## 2015-03-11 NOTE — Telephone Encounter (Signed)
Pt called, says he went to go get his rx filled and was told by the pharmacy that the medication would not be covered. I explained to the pt that we sent in a prior authorization for his medication and it was denied. I spoke to Dr. Naaman Plummer. He authorized a change to MS Contin 30 mg ER. I informed the pt that he could come by today and pickup his new script at the front desk. Patient was also asking for a letter from Dr. Naaman Plummer outlining various aspects of his condition how long his recovery might take. After discussing this with Dr. Naaman Plummer, he said he would be happy to help but he requested that the lawyer send a request that specifies exactly what information he needs. I relayed that to the pt as well.

## 2015-03-12 LAB — PRESCRIPTION MONITORING PROFILE (SOLSTAS)
Amphetamine/Meth: NEGATIVE ng/mL
Barbiturate Screen, Urine: NEGATIVE ng/mL
Buprenorphine, Urine: NEGATIVE ng/mL
Cannabinoid Scrn, Ur: NEGATIVE ng/mL
Carisoprodol, Urine: NEGATIVE ng/mL
Cocaine Metabolites: NEGATIVE ng/mL
Creatinine, Urine: 445.02 mg/dL
Fentanyl, Ur: NEGATIVE ng/mL
MDMA URINE: NEGATIVE ng/mL
Meperidine, Ur: NEGATIVE ng/mL
Methadone Screen, Urine: NEGATIVE ng/mL
Nitrites, Initial: NEGATIVE ug/mL
Propoxyphene: NEGATIVE ng/mL
Tapentadol, urine: NEGATIVE ng/mL
Tramadol Scrn, Ur: NEGATIVE ng/mL
Zolpidem, Urine: NEGATIVE ng/mL
pH, Initial: 5.5 pH (ref 4.5–8.9)

## 2015-03-19 ENCOUNTER — Encounter: Payer: Self-pay | Admitting: Internal Medicine

## 2015-03-19 ENCOUNTER — Ambulatory Visit: Payer: 59 | Attending: Internal Medicine | Admitting: Internal Medicine

## 2015-03-19 VITALS — BP 119/78 | HR 88 | Temp 98.6°F | Resp 16 | Ht 71.0 in | Wt 200.0 lb

## 2015-03-19 DIAGNOSIS — S72302G Unspecified fracture of shaft of left femur, subsequent encounter for closed fracture with delayed healing: Secondary | ICD-10-CM | POA: Diagnosis not present

## 2015-03-19 DIAGNOSIS — Z7982 Long term (current) use of aspirin: Secondary | ICD-10-CM | POA: Diagnosis not present

## 2015-03-19 DIAGNOSIS — F17210 Nicotine dependence, cigarettes, uncomplicated: Secondary | ICD-10-CM | POA: Insufficient documentation

## 2015-03-19 DIAGNOSIS — M62838 Other muscle spasm: Secondary | ICD-10-CM | POA: Insufficient documentation
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
## 2015-03-20 NOTE — Progress Notes (Signed)
Urine drug screen for this encounter is consistent for prescribed medication 

## 2015-03-26 ENCOUNTER — Encounter: Payer: 59 | Attending: Registered Nurse | Admitting: Registered Nurse

## 2015-03-26 ENCOUNTER — Encounter: Payer: Self-pay | Admitting: Registered Nurse

## 2015-03-26 VITALS — BP 120/86 | HR 70 | Resp 14

## 2015-03-26 DIAGNOSIS — S7012XS Contusion of left thigh, sequela: Secondary | ICD-10-CM | POA: Insufficient documentation

## 2015-03-26 DIAGNOSIS — D62 Acute posthemorrhagic anemia: Secondary | ICD-10-CM | POA: Insufficient documentation

## 2015-03-26 DIAGNOSIS — M62838 Other muscle spasm: Secondary | ICD-10-CM

## 2015-03-26 DIAGNOSIS — S72362S Displaced segmental fracture of shaft of left femur, sequela: Secondary | ICD-10-CM | POA: Insufficient documentation

## 2015-03-26 DIAGNOSIS — G894 Chronic pain syndrome: Secondary | ICD-10-CM | POA: Diagnosis not present

## 2015-03-26 DIAGNOSIS — Z5181 Encounter for therapeutic drug level monitoring: Secondary | ICD-10-CM | POA: Insufficient documentation

## 2015-03-26 DIAGNOSIS — Z79899 Other long term (current) drug therapy: Secondary | ICD-10-CM | POA: Insufficient documentation

## 2015-03-26 MED ORDER — MORPHINE SULFATE ER 30 MG PO TBCR: 30.0000 mg | EXTENDED_RELEASE_TABLET | Freq: Two times a day (BID) | ORAL | Status: DC

## 2015-03-26 MED ORDER — TIZANIDINE HCL 2 MG PO TABS: 2.0000 mg | ORAL_TABLET | Freq: Three times a day (TID) | ORAL | Status: DC | PRN

## 2015-03-26 MED ORDER — OXYCODONE HCL 10 MG PO TABS: 10.0000 mg | ORAL_TABLET | Freq: Four times a day (QID) | ORAL | Status: DC | PRN
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
## 2015-04-23 ENCOUNTER — Encounter: Payer: Self-pay | Admitting: Registered Nurse

## 2015-04-23 ENCOUNTER — Encounter: Payer: 59 | Attending: Registered Nurse | Admitting: Registered Nurse

## 2015-04-23 VITALS — BP 124/71 | HR 80 | Resp 14

## 2015-04-23 DIAGNOSIS — Z79899 Other long term (current) drug therapy: Secondary | ICD-10-CM

## 2015-04-23 DIAGNOSIS — Z5181 Encounter for therapeutic drug level monitoring: Secondary | ICD-10-CM | POA: Diagnosis not present

## 2015-04-23 DIAGNOSIS — S7012XS Contusion of left thigh, sequela: Secondary | ICD-10-CM | POA: Diagnosis not present

## 2015-04-23 DIAGNOSIS — S72362S Displaced segmental fracture of shaft of left femur, sequela: Secondary | ICD-10-CM

## 2015-04-23 DIAGNOSIS — G894 Chronic pain syndrome: Secondary | ICD-10-CM | POA: Diagnosis not present

## 2015-04-23 DIAGNOSIS — D62 Acute posthemorrhagic anemia: Secondary | ICD-10-CM

## 2015-04-23 DIAGNOSIS — M62838 Other muscle spasm: Secondary | ICD-10-CM

## 2015-04-23 MED ORDER — OXYCODONE HCL 10 MG PO TABS: 10.0000 mg | ORAL_TABLET | Freq: Four times a day (QID) | ORAL | Status: DC | PRN

## 2015-04-23 MED ORDER — TIZANIDINE HCL 2 MG PO TABS
2.0000 mg | ORAL_TABLET | Freq: Three times a day (TID) | ORAL | Status: DC | PRN
Start: 2015-04-23 — End: 2015-05-27

## 2015-04-23 MED ORDER — MORPHINE SULFATE ER 30 MG PO TBCR: 30.0000 mg | EXTENDED_RELEASE_TABLET | Freq: Two times a day (BID) | ORAL | Status: DC

## 2015-04-23 MED ORDER — DIAZEPAM 5 MG PO TABS: 5.0000 mg | ORAL_TABLET | Freq: Two times a day (BID) | ORAL | Status: DC

## 2015-04-23 MED ORDER — CYCLOBENZAPRINE HCL 10 MG PO TABS: 10.0000 mg | ORAL_TABLET | Freq: Two times a day (BID) | ORAL | Status: DC | PRN
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
## 2015-05-01 ENCOUNTER — Encounter: Payer: Self-pay | Admitting: Internal Medicine

## 2015-05-14 ENCOUNTER — Telehealth: Payer: Self-pay | Admitting: *Deleted

## 2015-05-14 NOTE — Telephone Encounter (Signed)
Gregory Ibarra called because he is having a lot of problems with constipation and needs something.  His stomach is hurting.

## 2015-05-15 NOTE — Telephone Encounter (Signed)
I spoke to Gregory Ibarra regarding his constipation. Encouraged to eat fruit, vegetables, and drink plenty of water. He was instructed to resume Colace twice a day., if no results try Miralax if no results try fleets enema. He verbalizes understanding. Instructed to call with any questions or concerns. His last bowel Movement was 05/14/15 he states.

## 2015-05-19 ENCOUNTER — Telehealth: Payer: Self-pay | Admitting: *Deleted

## 2015-05-19 NOTE — Telephone Encounter (Signed)
Gregory Ibarra called and say he needs a letter for his lawyer. It needs to state he cannot travel to [place].  It looks like there is a letter from Dr Feliciana Rossetti in the past but that future letters need to be through [place].  He sees you next week (05/27/15) and you can address it then.

## 2015-05-19 NOTE — Telephone Encounter (Signed)
i'm sure he'll remind me at that visit in July

## 2015-05-27 ENCOUNTER — Encounter: Payer: 59 | Attending: Physical Medicine & Rehabilitation | Admitting: Physical Medicine & Rehabilitation

## 2015-05-27 ENCOUNTER — Encounter: Payer: Self-pay | Admitting: Physical Medicine & Rehabilitation

## 2015-05-27 ENCOUNTER — Ambulatory Visit: Payer: 59 | Attending: Internal Medicine | Admitting: Internal Medicine

## 2015-05-27 ENCOUNTER — Encounter: Payer: Self-pay | Admitting: Internal Medicine

## 2015-05-27 VITALS — BP 129/84 | HR 93 | Temp 98.8°F | Ht 70.0 in | Wt 194.6 lb

## 2015-05-27 VITALS — BP 127/83 | HR 94 | Resp 16

## 2015-05-27 DIAGNOSIS — G894 Chronic pain syndrome: Secondary | ICD-10-CM | POA: Diagnosis not present

## 2015-05-27 DIAGNOSIS — K5909 Other constipation: Secondary | ICD-10-CM

## 2015-05-27 DIAGNOSIS — J45909 Unspecified asthma, uncomplicated: Secondary | ICD-10-CM | POA: Insufficient documentation

## 2015-05-27 DIAGNOSIS — S72362S Displaced segmental fracture of shaft of left femur, sequela: Secondary | ICD-10-CM | POA: Diagnosis not present

## 2015-05-27 DIAGNOSIS — T402X5A Adverse effect of other opioids, initial encounter: Secondary | ICD-10-CM

## 2015-05-27 DIAGNOSIS — Z7982 Long term (current) use of aspirin: Secondary | ICD-10-CM | POA: Diagnosis not present

## 2015-05-27 DIAGNOSIS — Z79899 Other long term (current) drug therapy: Secondary | ICD-10-CM

## 2015-05-27 DIAGNOSIS — F17210 Nicotine dependence, cigarettes, uncomplicated: Secondary | ICD-10-CM | POA: Diagnosis not present

## 2015-05-27 DIAGNOSIS — R0981 Nasal congestion: Secondary | ICD-10-CM | POA: Insufficient documentation

## 2015-05-27 DIAGNOSIS — S7012XS Contusion of left thigh, sequela: Secondary | ICD-10-CM

## 2015-05-27 DIAGNOSIS — K5903 Drug induced constipation: Secondary | ICD-10-CM

## 2015-05-27 DIAGNOSIS — K5900 Constipation, unspecified: Secondary | ICD-10-CM | POA: Diagnosis present

## 2015-05-27 DIAGNOSIS — D62 Acute posthemorrhagic anemia: Secondary | ICD-10-CM | POA: Diagnosis not present

## 2015-05-27 DIAGNOSIS — Z5181 Encounter for therapeutic drug level monitoring: Secondary | ICD-10-CM

## 2015-05-27 MED ORDER — MORPHINE SULFATE ER 15 MG PO TBCR: 15.0000 mg | EXTENDED_RELEASE_TABLET | Freq: Two times a day (BID) | ORAL | Status: DC

## 2015-05-27 MED ORDER — POLYETHYLENE GLYCOL 3350 17 GM/SCOOP PO POWD: 17.0000 g | Freq: Every day | ORAL | Status: DC

## 2015-05-27 MED ORDER — OXYCODONE HCL 10 MG PO TABS: 10.0000 mg | ORAL_TABLET | Freq: Four times a day (QID) | ORAL | Status: DC | PRN

## 2015-05-27 MED ORDER — SENNA 8.6 MG PO TABS: 1.0000 | ORAL_TABLET | Freq: Every evening | ORAL | Status: AC | PRN

## 2015-05-27 MED ORDER — FLUTICASONE PROPIONATE 50 MCG/ACT NA SUSP: 2.0000 | Freq: Every day | NASAL | Status: AC

## 2015-05-27 MED ORDER — TIZANIDINE HCL 2 MG PO TABS: 2.0000 mg | ORAL_TABLET | Freq: Every day | ORAL | Status: DC
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
## 2015-05-27 NOTE — Patient Instructions (Signed)
TRY ICE OR HEAT AT NIGHT BEFORE YOU GO TO BED.  ALSO TRY 2MG TO 4MG OF THE TIZANIDINE AT BEDTIME ALSO (MUSCLE RELAXANT
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
## 2015-05-27 NOTE — Patient Instructions (Signed)
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
## 2015-06-01 ENCOUNTER — Ambulatory Visit: Payer: 59 | Attending: Internal Medicine | Admitting: Physical Therapy

## 2015-06-01 DIAGNOSIS — R531 Weakness: Secondary | ICD-10-CM | POA: Insufficient documentation

## 2015-06-01 DIAGNOSIS — R269 Unspecified abnormalities of gait and mobility: Secondary | ICD-10-CM | POA: Insufficient documentation

## 2015-06-01 DIAGNOSIS — X58XXXD Exposure to other specified factors, subsequent encounter: Secondary | ICD-10-CM | POA: Insufficient documentation

## 2015-06-01 DIAGNOSIS — M25552 Pain in left hip: Secondary | ICD-10-CM | POA: Insufficient documentation

## 2015-06-01 DIAGNOSIS — S7292XG Unspecified fracture of left femur, subsequent encounter for closed fracture with delayed healing: Secondary | ICD-10-CM | POA: Insufficient documentation

## 2015-06-01 DIAGNOSIS — R2681 Unsteadiness on feet: Secondary | ICD-10-CM | POA: Insufficient documentation

## 2015-06-03 ENCOUNTER — Ambulatory Visit: Payer: 59 | Admitting: Physical Therapy

## 2015-06-03 DIAGNOSIS — S7292XG Unspecified fracture of left femur, subsequent encounter for closed fracture with delayed healing: Secondary | ICD-10-CM | POA: Diagnosis present

## 2015-06-03 DIAGNOSIS — R2681 Unsteadiness on feet: Secondary | ICD-10-CM

## 2015-06-03 DIAGNOSIS — R269 Unspecified abnormalities of gait and mobility: Secondary | ICD-10-CM | POA: Diagnosis not present

## 2015-06-03 DIAGNOSIS — R531 Weakness: Secondary | ICD-10-CM | POA: Diagnosis present

## 2015-06-03 DIAGNOSIS — M25552 Pain in left hip: Secondary | ICD-10-CM | POA: Diagnosis present

## 2015-06-03 DIAGNOSIS — X58XXXD Exposure to other specified factors, subsequent encounter: Secondary | ICD-10-CM | POA: Diagnosis not present
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
## 2015-06-12 ENCOUNTER — Ambulatory Visit: Payer: 59 | Admitting: Physical Therapy

## 2015-06-15 ENCOUNTER — Ambulatory Visit: Payer: 59 | Admitting: Physical Therapy

## 2015-06-15 DIAGNOSIS — R531 Weakness: Secondary | ICD-10-CM

## 2015-06-15 DIAGNOSIS — M25552 Pain in left hip: Secondary | ICD-10-CM

## 2015-06-15 DIAGNOSIS — R2681 Unsteadiness on feet: Secondary | ICD-10-CM

## 2015-06-15 DIAGNOSIS — R269 Unspecified abnormalities of gait and mobility: Secondary | ICD-10-CM
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
## 2015-06-15 NOTE — Patient Instructions (Addendum)
Short Arc Knee Extension - LEFT   Place bolster under left knee so it is bent slightly. Straighten knee. Hold for _2__ seconds. SLOWLY lower leg. Perform 15 reps, 2 times per day on the LEFT leg.     Hip Flexion / Knee Extension: Straight-Leg Raise (Eccentric)   Lie on back. Lift leg LEFT with knee straight. Slowly lower leg for 3-5 seconds. Make sure the back of knee and back of heel touch the bed at the same time. __3_ reps per set, __5_ sets per day.    Hip Flexor Stretch - LEFT   Lying on back near edge of bed, bend your RIGHT leg, foot flat. Hang LEFT leg over edge, relaxed. You shoulder feel a gentle stretch in the front of your thigh.  Hold for 60 seconds. Relax. Repeat. Do this 2-3 times per day. Bend LEFT knee (as pictured) to increase the stretch.  Log Roll   Lying on back, bend left knee and place left arm across chest. Roll all in one movement to the right. Reverse to roll to the left. Always move as one unit.   Fall Prevention and Home Safety Falls cause injuries and can affect all age groups. It is possible to use preventive measures to significantly decrease the likelihood of falls. There are many simple measures which can make your home safer and prevent falls. OUTDOORS  Repair cracks and edges of walkways and driveways.  Remove high doorway thresholds.  Trim shrubbery on the main path into your home.  Have good outside lighting.  Clear walkways of tools, rocks, debris, and clutter.  Check that handrails are not broken and are securely fastened. Both sides of steps should have handrails.  Have leaves, snow, and ice cleared regularly.  Use sand or salt on walkways during winter months.  In the garage, clean up grease or oil spills. BATHROOM  Install night lights.  Install grab bars by the toilet and in the tub and shower.  Use non-skid mats or decals in the tub or shower.  Place a plastic non-slip stool in the shower to sit on, if needed.  Keep  floors dry and clean up all water on the floor immediately.  Remove soap buildup in the tub or shower on a regular basis.  Secure bath mats with non-slip, double-sided rug tape.  Remove throw rugs and tripping hazards from the floors. BEDROOMS  Install night lights.  Make sure a bedside light is easy to reach.  Do not use oversized bedding.  Keep a telephone by your bedside.  Have a firm chair with side arms to use for getting dressed.  Remove throw rugs and tripping hazards from the floor. KITCHEN  Keep handles on pots and pans turned toward the center of the stove. Use back burners when possible.  Clean up spills quickly and allow time for drying.  Avoid walking on wet floors.  Avoid hot utensils and knives.  Position shelves so they are not too high or low.  Place commonly used objects within easy reach.  If necessary, use a sturdy step stool with a grab bar when reaching.  Keep electrical cables out of the way.  Do not use floor polish or wax that makes floors slippery. If you must use wax, use non-skid floor wax.  Remove throw rugs and tripping hazards from the floor. STAIRWAYS  Never leave objects on stairs.  Place handrails on both sides of stairways and use them. Fix any loose handrails. Make sure handrails on  both sides of the stairways are as long as the stairs.  Check carpeting to make sure it is firmly attached along stairs. Make repairs to worn or loose carpet promptly.  Avoid placing throw rugs at the top or bottom of stairways, or properly secure the rug with carpet tape to prevent slippage. Get rid of throw rugs, if possible.  Have an electrician put in a light switch at the top and bottom of the stairs. OTHER FALL PREVENTION TIPS  Wear low-heel or rubber-soled shoes that are supportive and fit well. Wear closed toe shoes.  When using a stepladder, make sure it is fully opened and both spreaders are firmly locked. Do not climb a closed  stepladder.  Add color or contrast paint or tape to grab bars and handrails in your home. Place contrasting color strips on first and last steps.  Learn and use mobility aids as needed. Install an electrical emergency response system.  Turn on lights to avoid dark areas. Replace light bulbs that burn out immediately. Get light switches that glow.  Arrange furniture to create clear pathways. Keep furniture in the same place.  Firmly attach carpet with non-skid or double-sided tape.  Eliminate uneven floor surfaces.  Select a carpet pattern that does not visually hide the edge of steps.  Be aware of all pets. OTHER HOME SAFETY TIPS  Set the water temperature for 120 F (48.8 C).  Keep emergency numbers on or near the telephone.  Keep smoke detectors on every level of the home and near sleeping areas. Document Released: 10/28/2002 Document Revised: 05/08/2012 Document Reviewed: 01/27/2012 [facility] Patient Information 2015 [place], [place]. This information is not intended to replace advice given to you by your health care provider. Make sure you discuss any questions you have with your health care provider.

## 2015-06-17 ENCOUNTER — Ambulatory Visit: Payer: 59 | Admitting: Physical Therapy

## 2015-06-22 ENCOUNTER — Ambulatory Visit: Payer: 59 | Attending: Internal Medicine | Admitting: Physical Therapy

## 2015-06-24 ENCOUNTER — Ambulatory Visit: Payer: 59 | Admitting: Physical Therapy

## 2015-06-29 ENCOUNTER — Ambulatory Visit: Payer: 59 | Admitting: Physical Therapy

## 2015-07-01 ENCOUNTER — Telehealth: Payer: Self-pay | Admitting: Physical Therapy

## 2015-07-01 ENCOUNTER — Ambulatory Visit: Payer: 59 | Admitting: Physical Therapy

## 2015-07-01 ENCOUNTER — Encounter: Payer: Self-pay | Admitting: Physical Therapy

## 2015-07-01 NOTE — Telephone Encounter (Addendum)
Have attempted to contact patient twice via phone due to pt having missed 4 consecutive PT appointments without calling. Number disconnected/unavailable both times.

## 2015-07-01 NOTE — Therapy (Signed)
[facility] [address] [place], [place], [address] Phone: [phone]   Fax:  [phone]  Patient Details  Name: Gregory Ibarra MRN: [id number] Date of Birth: 1976/03/07 Referring Provider:  No ref. provider found  Encounter Date: 07/01/2015  PHYSICAL THERAPY DISCHARGE SUMMARY  Visits from Start of Care: 2  Current functional level related to goals / functional outcomes:     PT Long Term Goals - 06/15/15 1738    PT LONG TERM GOAL #1   Title Pt will independently ambulate x1,000' over uneven, concrete surfaces with no overt LOB to progress toward PLOF, to indicate patient independence with community ambulation. Target date: 07/29/15.   Status On-going   PT LONG TERM GOAL #2   Title Pt will traverse community obstacles (standard curb, inclines/declined, surface changes) independently without LOB to indicate pt safety with community mobility. Target date: 07/29/15.   Status On-going   PT LONG TERM GOAL #3   Title Pt will report 2-point decrease in subjective pain rating within PT session to improve quality of life.  Target date: 07/29/15.   Status On-going   PT LONG TERM GOAL #4   Title Patient will negotiate four 6" stairs with single rail, reciprocal pattern to increase stability/independence with community mobility. Target date: 07/29/15.   Status On-going   PT LONG TERM GOAL #5   Title Pt will increase Primary FOTO score from 40 to 50 to indicate improvement in pt-perceived functional status. Target date: 07/29/15.   Status On-going        Remaining deficits: Unknown, as patient did not return to PT after initial visit (following evaluation).   Education / Equipment: Educated pt on HEP for strengthening/stretching, fall prevention strategies.  Plan:                                                    Patient goals were not met. Patient is being discharged due to not returning since the last visit.  ?????          Patient has not been present for 4 consecutive PT sessions without calling/cancelling. Have made multiple attempts to contact patient via phone; however, number disconnected/    Billie Ruddy, PT, [facility] [address] [place], [place], [address] Phone: [phone]   Fax:  [phone] 07/01/2015, 1:40 PM

## 2015-07-06 ENCOUNTER — Ambulatory Visit: Payer: 59 | Admitting: Physical Therapy

## 2015-07-08 ENCOUNTER — Encounter: Payer: Self-pay | Admitting: Registered Nurse

## 2015-07-08 ENCOUNTER — Encounter: Payer: 59 | Attending: Physical Medicine & Rehabilitation | Admitting: Registered Nurse

## 2015-07-08 ENCOUNTER — Ambulatory Visit (HOSPITAL_COMMUNITY)
Admission: RE | Admit: 2015-07-08 | Discharge: 2015-07-08 | Disposition: A | Discharge status: Home / Self Care | Payer: 59 | Source: Ambulatory Visit | Attending: Registered Nurse | Admitting: Registered Nurse

## 2015-07-08 VITALS — BP 120/79 | HR 63

## 2015-07-08 DIAGNOSIS — S7012XS Contusion of left thigh, sequela: Secondary | ICD-10-CM | POA: Insufficient documentation

## 2015-07-08 DIAGNOSIS — Z5181 Encounter for therapeutic drug level monitoring: Secondary | ICD-10-CM | POA: Diagnosis not present

## 2015-07-08 DIAGNOSIS — M6530 Trigger finger, unspecified finger: Secondary | ICD-10-CM

## 2015-07-08 DIAGNOSIS — D62 Acute posthemorrhagic anemia: Secondary | ICD-10-CM

## 2015-07-08 DIAGNOSIS — S72362S Displaced segmental fracture of shaft of left femur, sequela: Secondary | ICD-10-CM | POA: Diagnosis not present

## 2015-07-08 DIAGNOSIS — G894 Chronic pain syndrome: Secondary | ICD-10-CM | POA: Insufficient documentation

## 2015-07-08 DIAGNOSIS — Z79899 Other long term (current) drug therapy: Secondary | ICD-10-CM | POA: Diagnosis not present

## 2015-07-08 DIAGNOSIS — M79642 Pain in left hand: Secondary | ICD-10-CM | POA: Insufficient documentation

## 2015-07-08 MED ORDER — OXYCODONE HCL 10 MG PO TABS: 10.0000 mg | ORAL_TABLET | Freq: Four times a day (QID) | ORAL | Status: DC | PRN

## 2015-07-08 MED ORDER — MORPHINE SULFATE ER 15 MG PO TBCR: 15.0000 mg | EXTENDED_RELEASE_TABLET | Freq: Every day | ORAL | Status: DC

## 2015-07-08 MED ORDER — METHYLPREDNISOLONE 4 MG PO TBPK: ORAL_TABLET | ORAL | Status: DC

## 2015-07-08 MED ORDER — DIAZEPAM 5 MG PO TABS: 5.0000 mg | ORAL_TABLET | Freq: Two times a day (BID) | ORAL | Status: DC

## 2015-07-08 NOTE — Patient Instructions (Signed)
No Script Of Morphine   Decreased Morphine to Daily  Then Discontinue
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
## 2015-07-09 ENCOUNTER — Other Ambulatory Visit: Payer: Self-pay | Admitting: Registered Nurse

## 2015-07-10 ENCOUNTER — Ambulatory Visit: Payer: 59 | Admitting: Physical Therapy

## 2015-07-10 LAB — PMP ALCOHOL METABOLITE (ETG): Ethyl Glucuronide (EtG): NEGATIVE ng/mL

## 2015-07-13 ENCOUNTER — Ambulatory Visit: Payer: 59 | Admitting: Physical Therapy

## 2015-07-13 LAB — BENZODIAZEPINES (GC/LC/MS), URINE
Alprazolam metabolite (GC/LC/MS), ur confirm: NEGATIVE ng/mL (ref <25)
CLONAZEPAU: NEGATIVE ng/mL (ref <25)
FLURAZEPAMU: NEGATIVE ng/mL (ref <50)
Lorazepam (GC/LC/MS), ur confirm: NEGATIVE ng/mL (ref <50)
Midazolam (GC/LC/MS), ur confirm: NEGATIVE ng/mL (ref <50)
NORDIAZEPAMU: 98 ng/mL (ref <50)
OXAZEPAMU: 1219 ng/mL (ref <50)
Temazepam (GC/LC/MS), ur confirm: 421 ng/mL (ref <50)
Triazolam metabolite (GC/LC/MS), ur confirm: NEGATIVE ng/mL (ref <50)

## 2015-07-13 LAB — OPIATES/OPIOIDS (LC/MS-MS)
Codeine Urine: NEGATIVE ng/mL (ref <50)
Hydrocodone: NEGATIVE ng/mL (ref <50)
Hydromorphone: 101 ng/mL (ref <50)
Morphine Urine: 5603 ng/mL (ref <50)
NOROXYCODONE, UR: 4067 ng/mL (ref <50)
Norhydrocodone, Ur: NEGATIVE ng/mL (ref <50)
OXYCODONE, UR: 2335 ng/mL (ref <50)
Oxymorphone: 6221 ng/mL (ref <50)

## 2015-07-13 LAB — OXYCODONE, URINE (LC/MS-MS)
NOROXYCODONE, UR: 4067 ng/mL (ref <50)
OXYCODONE, UR: 2335 ng/mL (ref <50)
Oxymorphone: 6221 ng/mL (ref <50)

## 2015-07-14 LAB — PRESCRIPTION MONITORING PROFILE (SOLSTAS)
Amphetamine/Meth: NEGATIVE ng/mL
BUPRENORPHINE, URINE: NEGATIVE ng/mL
Barbiturate Screen, Urine: NEGATIVE ng/mL
Cannabinoid Scrn, Ur: NEGATIVE ng/mL
Carisoprodol, Urine: NEGATIVE ng/mL
Cocaine Metabolites: NEGATIVE ng/mL
Creatinine, Urine: 230.49 mg/dL (ref >20.0)
ECSTASY: NEGATIVE ng/mL
FENTANYL URINE: NEGATIVE ng/mL
METHADONE SCREEN, URINE: NEGATIVE ng/mL
Meperidine, Ur: NEGATIVE ng/mL
NITRITES URINE, INITIAL: NEGATIVE ug/mL
PROPOXYPHENE: NEGATIVE ng/mL
Tapentadol, urine: NEGATIVE ng/mL
Tramadol Scrn, Ur: NEGATIVE ng/mL
Zolpidem, Urine: NEGATIVE ng/mL
pH, Initial: 5.7 pH (ref 4.5–8.9)

## 2015-07-15 ENCOUNTER — Ambulatory Visit: Payer: 59 | Admitting: Physical Therapy

## 2015-07-17 ENCOUNTER — Telehealth: Payer: Self-pay | Admitting: Registered Nurse

## 2015-07-17 NOTE — Telephone Encounter (Signed)
Spoke with Gregory Ibarra regarding X-ray results. He verbalizes understanding.

## 2015-07-20 ENCOUNTER — Ambulatory Visit: Payer: 59 | Admitting: Physical Therapy

## 2015-07-22 ENCOUNTER — Ambulatory Visit: Payer: 59 | Admitting: Physical Therapy

## 2015-07-29 ENCOUNTER — Ambulatory Visit: Payer: 59 | Admitting: Physical Therapy

## 2015-07-29 NOTE — Progress Notes (Signed)
Urine drug screen for this encounter is consistent for prescribed medication 

## 2015-07-30 ENCOUNTER — Other Ambulatory Visit: Payer: Self-pay | Admitting: *Deleted

## 2015-07-30 DIAGNOSIS — S72362S Displaced segmental fracture of shaft of left femur, sequela: Secondary | ICD-10-CM

## 2015-07-30 DIAGNOSIS — G894 Chronic pain syndrome: Secondary | ICD-10-CM

## 2015-07-30 DIAGNOSIS — Z79899 Other long term (current) drug therapy: Secondary | ICD-10-CM

## 2015-07-30 DIAGNOSIS — S7012XS Contusion of left thigh, sequela: Secondary | ICD-10-CM

## 2015-07-30 DIAGNOSIS — Z5181 Encounter for therapeutic drug level monitoring: Secondary | ICD-10-CM

## 2015-07-30 MED ORDER — TIZANIDINE HCL 2 MG PO TABS: 2.0000 mg | ORAL_TABLET | Freq: Every day | ORAL | Status: DC

## 2015-07-31 ENCOUNTER — Ambulatory Visit: Payer: 59 | Admitting: Physical Therapy

## 2015-08-11 ENCOUNTER — Encounter: Payer: 59 | Attending: Physical Medicine & Rehabilitation | Admitting: Registered Nurse

## 2015-08-11 ENCOUNTER — Encounter: Payer: Self-pay | Admitting: Registered Nurse

## 2015-08-11 VITALS — BP 126/73 | HR 73

## 2015-08-11 DIAGNOSIS — G894 Chronic pain syndrome: Secondary | ICD-10-CM | POA: Insufficient documentation

## 2015-08-11 DIAGNOSIS — D62 Acute posthemorrhagic anemia: Secondary | ICD-10-CM | POA: Insufficient documentation

## 2015-08-11 DIAGNOSIS — Z79899 Other long term (current) drug therapy: Secondary | ICD-10-CM | POA: Diagnosis not present

## 2015-08-11 DIAGNOSIS — S7012XS Contusion of left thigh, sequela: Secondary | ICD-10-CM | POA: Diagnosis not present

## 2015-08-11 DIAGNOSIS — M62838 Other muscle spasm: Secondary | ICD-10-CM

## 2015-08-11 DIAGNOSIS — S72362S Displaced segmental fracture of shaft of left femur, sequela: Secondary | ICD-10-CM

## 2015-08-11 DIAGNOSIS — Z5181 Encounter for therapeutic drug level monitoring: Secondary | ICD-10-CM | POA: Diagnosis not present

## 2015-08-11 MED ORDER — OXYCODONE HCL 10 MG PO TABS: 10.0000 mg | ORAL_TABLET | Freq: Four times a day (QID) | ORAL | Status: DC | PRN
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
## 2015-08-28 ENCOUNTER — Telehealth: Payer: Self-pay | Admitting: Physical Medicine & Rehabilitation

## 2015-08-28 NOTE — Telephone Encounter (Signed)
Patient left a message that his back was stiff.  Called patient back and he thinks he may have pulled a muscle when he bent over.  I advised that he may want to go to Urgent Care to get this looked at.

## 2015-09-06 DIAGNOSIS — F1020 Alcohol dependence, uncomplicated: Secondary | ICD-10-CM | POA: Diagnosis not present

## 2015-09-06 DIAGNOSIS — Z5181 Encounter for therapeutic drug level monitoring: Secondary | ICD-10-CM | POA: Diagnosis not present

## 2015-09-06 DIAGNOSIS — Z4789 Encounter for other orthopedic aftercare: Secondary | ICD-10-CM | POA: Diagnosis not present

## 2015-09-15 ENCOUNTER — Encounter: Payer: Self-pay | Admitting: Registered Nurse

## 2015-09-15 ENCOUNTER — Encounter: Payer: 59 | Attending: Physical Medicine & Rehabilitation | Admitting: Registered Nurse

## 2015-09-15 VITALS — BP 132/84 | HR 76 | Resp 15

## 2015-09-15 DIAGNOSIS — M62838 Other muscle spasm: Secondary | ICD-10-CM

## 2015-09-15 DIAGNOSIS — S72362S Displaced segmental fracture of shaft of left femur, sequela: Secondary | ICD-10-CM | POA: Diagnosis not present

## 2015-09-15 DIAGNOSIS — D62 Acute posthemorrhagic anemia: Secondary | ICD-10-CM | POA: Insufficient documentation

## 2015-09-15 DIAGNOSIS — S7012XS Contusion of left thigh, sequela: Secondary | ICD-10-CM | POA: Diagnosis not present

## 2015-09-15 DIAGNOSIS — Z5181 Encounter for therapeutic drug level monitoring: Secondary | ICD-10-CM | POA: Diagnosis not present

## 2015-09-15 DIAGNOSIS — G894 Chronic pain syndrome: Secondary | ICD-10-CM | POA: Diagnosis not present

## 2015-09-15 DIAGNOSIS — Z79899 Other long term (current) drug therapy: Secondary | ICD-10-CM | POA: Insufficient documentation

## 2015-09-15 MED ORDER — OXYCODONE HCL 10 MG PO TABS: 10.0000 mg | ORAL_TABLET | Freq: Four times a day (QID) | ORAL | Status: DC | PRN
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
## 2015-10-05 ENCOUNTER — Telehealth: Payer: Self-pay | Admitting: Physical Medicine & Rehabilitation

## 2015-10-05 NOTE — Telephone Encounter (Signed)
Patient is needing a letter stating he is disabled to give to his lawyer.  Please call patient when this is completed.

## 2015-10-07 NOTE — Telephone Encounter (Signed)
Can you handle this? Thanks

## 2015-10-19 ENCOUNTER — Ambulatory Visit: Payer: 59 | Admitting: Physical Medicine & Rehabilitation

## 2015-10-19 ENCOUNTER — Encounter: Payer: 59 | Attending: Physical Medicine & Rehabilitation | Admitting: Registered Nurse

## 2015-10-19 ENCOUNTER — Encounter: Payer: Self-pay | Admitting: Physical Medicine & Rehabilitation

## 2015-10-19 ENCOUNTER — Encounter: Payer: Self-pay | Admitting: Registered Nurse

## 2015-10-19 VITALS — BP 126/79 | HR 73

## 2015-10-19 DIAGNOSIS — Z79899 Other long term (current) drug therapy: Secondary | ICD-10-CM | POA: Insufficient documentation

## 2015-10-19 DIAGNOSIS — D62 Acute posthemorrhagic anemia: Secondary | ICD-10-CM | POA: Diagnosis not present

## 2015-10-19 DIAGNOSIS — Z5181 Encounter for therapeutic drug level monitoring: Secondary | ICD-10-CM | POA: Insufficient documentation

## 2015-10-19 DIAGNOSIS — S72362S Displaced segmental fracture of shaft of left femur, sequela: Secondary | ICD-10-CM | POA: Insufficient documentation

## 2015-10-19 DIAGNOSIS — S7012XS Contusion of left thigh, sequela: Secondary | ICD-10-CM | POA: Insufficient documentation

## 2015-10-19 DIAGNOSIS — G894 Chronic pain syndrome: Secondary | ICD-10-CM | POA: Insufficient documentation

## 2015-10-19 DIAGNOSIS — R296 Repeated falls: Secondary | ICD-10-CM

## 2015-10-19 DIAGNOSIS — M62838 Other muscle spasm: Secondary | ICD-10-CM

## 2015-10-19 DIAGNOSIS — R2689 Other abnormalities of gait and mobility: Secondary | ICD-10-CM

## 2015-10-19 MED ORDER — OXYCODONE HCL 10 MG PO TABS: 10.0000 mg | ORAL_TABLET | Freq: Four times a day (QID) | ORAL | Status: DC | PRN

## 2015-10-19 MED ORDER — TIZANIDINE HCL 2 MG PO TABS: 2.0000 mg | ORAL_TABLET | Freq: Every day | ORAL | Status: DC

## 2015-10-19 MED ORDER — DIAZEPAM 5 MG PO TABS: 5.0000 mg | ORAL_TABLET | Freq: Two times a day (BID) | ORAL | Status: DC
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
## 2015-10-19 NOTE — Telephone Encounter (Signed)
Spoke with pt. Letter is ready at the front and he will be by to pick it up.

## 2015-10-19 NOTE — Telephone Encounter (Signed)
Please print and have Naaman Plummer sign.  Please call patient and let him know the letter is ready. Thank you

## 2015-11-11 ENCOUNTER — Encounter: Payer: Self-pay | Admitting: Physical Medicine & Rehabilitation

## 2015-11-11 ENCOUNTER — Encounter: Payer: 59 | Attending: Physical Medicine & Rehabilitation | Admitting: Physical Medicine & Rehabilitation

## 2015-11-11 VITALS — BP 132/84 | HR 90

## 2015-11-11 DIAGNOSIS — D62 Acute posthemorrhagic anemia: Secondary | ICD-10-CM | POA: Diagnosis not present

## 2015-11-11 DIAGNOSIS — M5442 Lumbago with sciatica, left side: Secondary | ICD-10-CM

## 2015-11-11 DIAGNOSIS — Z79899 Other long term (current) drug therapy: Secondary | ICD-10-CM | POA: Diagnosis not present

## 2015-11-11 DIAGNOSIS — S7012XS Contusion of left thigh, sequela: Secondary | ICD-10-CM | POA: Diagnosis not present

## 2015-11-11 DIAGNOSIS — Z5181 Encounter for therapeutic drug level monitoring: Secondary | ICD-10-CM | POA: Diagnosis not present

## 2015-11-11 DIAGNOSIS — G894 Chronic pain syndrome: Secondary | ICD-10-CM | POA: Diagnosis present

## 2015-11-11 DIAGNOSIS — S72362S Displaced segmental fracture of shaft of left femur, sequela: Secondary | ICD-10-CM

## 2015-11-11 DIAGNOSIS — M545 Low back pain, unspecified: Secondary | ICD-10-CM | POA: Insufficient documentation

## 2015-11-11 MED ORDER — OXYCODONE HCL 10 MG PO TABS: 10.0000 mg | ORAL_TABLET | Freq: Four times a day (QID) | ORAL | Status: DC | PRN

## 2015-11-11 MED ORDER — GABAPENTIN 300 MG PO CAPS: 300.0000 mg | ORAL_CAPSULE | Freq: Three times a day (TID) | ORAL | Status: DC
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
## 2015-11-11 NOTE — Patient Instructions (Signed)
GABAPENTIN: 300MG AT NIGHT FOR 5 DAYS, THEN 300MG 2X DAILY FOR 5 DAYS, THEN 3 X DAILY.    FIND OUT EXACTLY WHERE THE MRI OF YOUR BACK WAS FROM AND FIND THE FILE/DISC

## 2015-11-24 ENCOUNTER — Ambulatory Visit: Payer: 59 | Admitting: Physical Therapy

## 2015-12-01 ENCOUNTER — Ambulatory Visit: Payer: BLUE CROSS/BLUE SHIELD | Attending: Registered Nurse | Admitting: Physical Therapy

## 2015-12-22 ENCOUNTER — Ambulatory Visit (HOSPITAL_COMMUNITY)
Admission: RE | Admit: 2015-12-22 | Discharge: 2015-12-22 | Disposition: A | Discharge status: Home / Self Care | Payer: BLUE CROSS/BLUE SHIELD | Source: Ambulatory Visit | Attending: Physical Medicine & Rehabilitation | Admitting: Physical Medicine & Rehabilitation

## 2015-12-22 DIAGNOSIS — M5135 Other intervertebral disc degeneration, thoracolumbar region: Secondary | ICD-10-CM | POA: Diagnosis not present

## 2015-12-22 DIAGNOSIS — G894 Chronic pain syndrome: Secondary | ICD-10-CM | POA: Insufficient documentation

## 2015-12-22 DIAGNOSIS — M545 Low back pain: Secondary | ICD-10-CM | POA: Diagnosis not present

## 2015-12-22 DIAGNOSIS — M5442 Lumbago with sciatica, left side: Secondary | ICD-10-CM

## 2015-12-22 DIAGNOSIS — M79605 Pain in left leg: Secondary | ICD-10-CM | POA: Insufficient documentation

## 2015-12-22 DIAGNOSIS — M5136 Other intervertebral disc degeneration, lumbar region: Secondary | ICD-10-CM | POA: Insufficient documentation

## 2015-12-22 DIAGNOSIS — Z79899 Other long term (current) drug therapy: Secondary | ICD-10-CM

## 2015-12-22 DIAGNOSIS — Z5181 Encounter for therapeutic drug level monitoring: Secondary | ICD-10-CM | POA: Diagnosis not present

## 2015-12-22 DIAGNOSIS — S72362S Displaced segmental fracture of shaft of left femur, sequela: Secondary | ICD-10-CM

## 2015-12-23 ENCOUNTER — Telehealth: Payer: Self-pay | Admitting: Physical Medicine & Rehabilitation

## 2015-12-23 ENCOUNTER — Encounter
Payer: BLUE CROSS/BLUE SHIELD | Attending: Physical Medicine & Rehabilitation | Admitting: Physical Medicine & Rehabilitation

## 2015-12-23 ENCOUNTER — Encounter: Payer: Self-pay | Admitting: Physical Medicine & Rehabilitation

## 2015-12-23 VITALS — BP 133/79 | HR 56 | Resp 14

## 2015-12-23 DIAGNOSIS — M5416 Radiculopathy, lumbar region: Secondary | ICD-10-CM | POA: Insufficient documentation

## 2015-12-23 DIAGNOSIS — G894 Chronic pain syndrome: Secondary | ICD-10-CM | POA: Insufficient documentation

## 2015-12-23 DIAGNOSIS — Z5181 Encounter for therapeutic drug level monitoring: Secondary | ICD-10-CM | POA: Diagnosis not present

## 2015-12-23 DIAGNOSIS — M5136 Other intervertebral disc degeneration, lumbar region: Secondary | ICD-10-CM | POA: Insufficient documentation

## 2015-12-23 DIAGNOSIS — S7012XS Contusion of left thigh, sequela: Secondary | ICD-10-CM | POA: Insufficient documentation

## 2015-12-23 DIAGNOSIS — M79644 Pain in right finger(s): Secondary | ICD-10-CM | POA: Insufficient documentation

## 2015-12-23 DIAGNOSIS — S72362S Displaced segmental fracture of shaft of left femur, sequela: Secondary | ICD-10-CM | POA: Diagnosis not present

## 2015-12-23 DIAGNOSIS — Z79899 Other long term (current) drug therapy: Secondary | ICD-10-CM | POA: Diagnosis not present

## 2015-12-23 DIAGNOSIS — M5442 Lumbago with sciatica, left side: Secondary | ICD-10-CM

## 2015-12-23 DIAGNOSIS — D62 Acute posthemorrhagic anemia: Secondary | ICD-10-CM | POA: Insufficient documentation

## 2015-12-23 DIAGNOSIS — M51369 Other intervertebral disc degeneration, lumbar region without mention of lumbar back pain or lower extremity pain: Secondary | ICD-10-CM | POA: Insufficient documentation

## 2015-12-23 MED ORDER — OXYCODONE HCL 10 MG PO TABS: 10.0000 mg | ORAL_TABLET | Freq: Four times a day (QID) | ORAL | Status: DC | PRN

## 2015-12-23 MED ORDER — DIAZEPAM 2 MG PO TABS: 5.0000 mg | ORAL_TABLET | Freq: Two times a day (BID) | ORAL | Status: DC
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
## 2015-12-23 NOTE — Telephone Encounter (Signed)
I reviewed [name] MRI from 04/2014. He has significant DDD at L5-S1 especially on the left with foraminal impingement. I would recommend a left transforaminal ESI at L5-S1 per Dr. Letta Pate as available. Please notify the patient and ask if he would like to proceed.  thx

## 2015-12-23 NOTE — Patient Instructions (Signed)
PLEASE CALL ME WITH ANY PROBLEMS OR QUESTIONS (#[phone]).     I WILL REVIEW YOUR MRI AND THEN WE CAN COME UP WITH A PLAN FOR POTENTIAL INJECTION.

## 2015-12-24 ENCOUNTER — Ambulatory Visit: Payer: BLUE CROSS/BLUE SHIELD | Attending: Internal Medicine | Admitting: Internal Medicine

## 2015-12-24 ENCOUNTER — Encounter: Payer: Self-pay | Admitting: Internal Medicine

## 2015-12-24 VITALS — BP 124/84 | HR 72 | Temp 98.0°F | Resp 16 | Ht 70.0 in | Wt 200.0 lb

## 2015-12-24 DIAGNOSIS — F17210 Nicotine dependence, cigarettes, uncomplicated: Secondary | ICD-10-CM | POA: Insufficient documentation

## 2015-12-24 DIAGNOSIS — F419 Anxiety disorder, unspecified: Secondary | ICD-10-CM | POA: Insufficient documentation

## 2015-12-24 DIAGNOSIS — R197 Diarrhea, unspecified: Secondary | ICD-10-CM

## 2015-12-24 DIAGNOSIS — Z888 Allergy status to other drugs, medicaments and biological substances status: Secondary | ICD-10-CM | POA: Insufficient documentation

## 2015-12-24 DIAGNOSIS — M419 Scoliosis, unspecified: Secondary | ICD-10-CM | POA: Insufficient documentation

## 2015-12-24 DIAGNOSIS — Z79899 Other long term (current) drug therapy: Secondary | ICD-10-CM | POA: Diagnosis not present

## 2015-12-24 DIAGNOSIS — M5136 Other intervertebral disc degeneration, lumbar region: Secondary | ICD-10-CM | POA: Insufficient documentation

## 2015-12-24 DIAGNOSIS — Z7982 Long term (current) use of aspirin: Secondary | ICD-10-CM | POA: Diagnosis not present

## 2015-12-24 DIAGNOSIS — J45909 Unspecified asthma, uncomplicated: Secondary | ICD-10-CM | POA: Insufficient documentation

## 2015-12-24 DIAGNOSIS — R5383 Other fatigue: Secondary | ICD-10-CM | POA: Diagnosis not present

## 2015-12-24 NOTE — Telephone Encounter (Signed)
Marie please schedule
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
## 2015-12-24 NOTE — Progress Notes (Signed)
Patient complains of having loose stool the past two weeks Patient recently finished up a regiment of doxy cycline for his acne Patient is not sure what is causing this

## 2015-12-25 ENCOUNTER — Telehealth: Payer: Self-pay

## 2015-12-25 LAB — CBC WITH DIFFERENTIAL/PLATELET
BASOS ABS: 0 10*3/uL (ref 0.0–0.1)
BASOS PCT: 0 % (ref 0–1)
EOS PCT: 1 % (ref 0–5)
Eosinophils Absolute: 0.1 10*3/uL (ref 0.0–0.7)
HCT: 45.7 % (ref 39.0–52.0)
HEMOGLOBIN: 16 g/dL (ref 13.0–17.0)
Lymphocytes Relative: 28 % (ref 12–46)
Lymphs Abs: 2.3 10*3/uL (ref 0.7–4.0)
MCH: 29.2 pg (ref 26.0–34.0)
MCHC: 35 g/dL (ref 30.0–36.0)
MCV: 83.4 fL (ref 78.0–100.0)
MONO ABS: 0.6 10*3/uL (ref 0.1–1.0)
MPV: 10.2 fL (ref 8.6–12.4)
Monocytes Relative: 8 % (ref 3–12)
NEUTROS ABS: 5.1 10*3/uL (ref 1.7–7.7)
Neutrophils Relative %: 63 % (ref 43–77)
Platelets: 260 10*3/uL (ref 150–400)
RBC: 5.48 MIL/uL (ref 4.22–5.81)
RDW: 14.1 % (ref 11.5–15.5)
WBC: 8.1 10*3/uL (ref 4.0–10.5)

## 2015-12-25 LAB — COMPLETE METABOLIC PANEL WITH GFR
ALT: 22 U/L (ref 9–46)
AST: 19 U/L (ref 10–40)
Albumin: 4.8 g/dL (ref 3.6–5.1)
Alkaline Phosphatase: 90 U/L (ref 40–115)
BUN: 7 mg/dL (ref 7–25)
CHLORIDE: 103 mmol/L (ref 98–110)
CO2: 24 mmol/L (ref 20–31)
Calcium: 9.8 mg/dL (ref 8.6–10.3)
Creat: 0.85 mg/dL (ref 0.60–1.35)
GFR, Est African American: >89 mL/min (ref >=60)
GFR, Est Non African American: >89 mL/min (ref >=60)
GLUCOSE: 89 mg/dL (ref 65–99)
POTASSIUM: 4.6 mmol/L (ref 3.5–5.3)
SODIUM: 141 mmol/L (ref 135–146)
Total Bilirubin: 1.3 mg/dL — ABNORMAL HIGH (ref 0.2–1.2)
Total Protein: 7 g/dL (ref 6.1–8.1)

## 2015-12-25 NOTE — Telephone Encounter (Signed)
-----   Message from Lance Bosch, NP sent at 12/25/2015  1:16 PM EST ----- Labs normal

## 2015-12-25 NOTE — Telephone Encounter (Signed)
Spoke with patient this am and he is aware of his normal lab results

## 2015-12-26 LAB — TOXASSURE SELECT,+ANTIDEPR,UR: PDF: 0

## 2015-12-28 NOTE — Progress Notes (Signed)
UDS is consistent with prescribed medications.

## 2015-12-28 NOTE — Telephone Encounter (Signed)
Spoke to patient Scheduled an appt - 3 weeks form now

## 2016-01-05 ENCOUNTER — Other Ambulatory Visit: Payer: Self-pay | Admitting: Physical Medicine & Rehabilitation

## 2016-01-05 ENCOUNTER — Other Ambulatory Visit: Payer: Self-pay | Admitting: Registered Nurse

## 2016-01-07 ENCOUNTER — Telehealth: Payer: Self-pay | Admitting: *Deleted

## 2016-01-07 DIAGNOSIS — G894 Chronic pain syndrome: Secondary | ICD-10-CM

## 2016-01-07 DIAGNOSIS — D62 Acute posthemorrhagic anemia: Secondary | ICD-10-CM

## 2016-01-07 DIAGNOSIS — Z5181 Encounter for therapeutic drug level monitoring: Secondary | ICD-10-CM

## 2016-01-07 DIAGNOSIS — S7012XS Contusion of left thigh, sequela: Secondary | ICD-10-CM

## 2016-01-07 DIAGNOSIS — S72362S Displaced segmental fracture of shaft of left femur, sequela: Secondary | ICD-10-CM

## 2016-01-07 DIAGNOSIS — Z79899 Other long term (current) drug therapy: Secondary | ICD-10-CM

## 2016-01-07 MED ORDER — DIAZEPAM 2 MG PO TABS: 2.0000 mg | ORAL_TABLET | Freq: Two times a day (BID) | ORAL | Status: DC

## 2016-01-07 NOTE — Telephone Encounter (Signed)
rec'd Rx fax request for pts Valium prescription.  It is calling for valium 5 mg, take 1 tablet by mouth twice daily at 12 noon and 4pm.  Your last clinic note indicates reduce valium to 44m BID.  When I look at his medication list the sig indicates 2 mg tablets but it says take 2.5 tabs at noon and 2.5 tabs at 4pm....please advise

## 2016-01-07 NOTE — Telephone Encounter (Signed)
Corrected sig called to pharmacy

## 2016-01-07 NOTE — Telephone Encounter (Signed)
It should be 20m bid. thanks

## 2016-01-18 ENCOUNTER — Ambulatory Visit (HOSPITAL_BASED_OUTPATIENT_CLINIC_OR_DEPARTMENT_OTHER): Payer: BLUE CROSS/BLUE SHIELD | Admitting: Physical Medicine & Rehabilitation

## 2016-01-18 ENCOUNTER — Encounter: Payer: Self-pay | Admitting: Physical Medicine & Rehabilitation

## 2016-01-18 VITALS — BP 124/71 | HR 60 | Resp 16

## 2016-01-18 DIAGNOSIS — M5416 Radiculopathy, lumbar region: Secondary | ICD-10-CM | POA: Diagnosis not present

## 2016-01-18 DIAGNOSIS — G894 Chronic pain syndrome: Secondary | ICD-10-CM | POA: Diagnosis not present

## 2016-01-18 NOTE — Progress Notes (Signed)
  PROCEDURE RECORD [facility]   Name: Bee Hammerschmidt DOB:[birth year] MRN: [id number]  Date:01/18/2016  Physician: Alysia Penna, MD    Nurse/CMA: Sheretta Grumbine, CMA  Allergies:  Allergies  Allergen Reactions  . Sulfa Antibiotics Rash  . Sulfamethoxazole-Trimethoprim Rash    Bactrim    Consent Signed: Yes.    Is patient diabetic? No.  CBG today? N/A  Pregnant: No. LMP: No LMP for male patient. (age 40-55)  Anticoagulants: no Anti-inflammatory: no Antibiotics: no  Procedure: left transforaminal epidural steroid injection Position: Prone Start Time: 3:47pm  End Time: 3:52pnm  Fluoro Time: 25  RN/CMA Emmry Hinsch, CMA Avyukth Bontempo, CMA    Time 3:18pm 3:57pm    BP 124/71 130/71    Pulse 60 60    Respirations 16 16    O2 Sat 97 99    S/S 6 6    Pain Level 8/10 8/10     D/C home with girlfriend, patient A & O X 3, D/C instructions reviewed, and sits independently.

## 2016-01-18 NOTE — Patient Instructions (Signed)

## 2016-01-18 NOTE — Progress Notes (Signed)
Left L5-S1 Lumbar transforaminal epidural steroid injection under fluoroscopic guidance  Indication: Lumbosacral radiculitis is not relieved by medication management or other conservative care and interfering with self-care and mobility.   Informed consent was obtained after describing risk and benefits of the procedure with the patient, this includes bleeding, bruising, infection, paralysis and medication side effects.  The patient wishes to proceed and has given written consent.  Patient was placed in prone position.  The lumbar area was marked and prepped with Betadine.  It was entered with a 25-gauge 1-1/2 inch needle and one mL of 1% lidocaine was injected into the skin and subcutaneous tissue.  Then a 22-gauge 3.5 inch spinal needle was inserted into the Left L5-S1 intervertebral foramen under AP, lateral, and oblique view. Please note that the L5 is partially sacralized and the foramen resembled a sacral foramen. Then a solution containing one mL of 10 mg per mL dexamethasone and 2 mL of 1% lidocaine was injected.  The patient tolerated procedure well.  Post procedure instructions were given.  Please see post procedure form.

## 2016-01-26 ENCOUNTER — Encounter
Payer: BLUE CROSS/BLUE SHIELD | Attending: Physical Medicine & Rehabilitation | Admitting: Physical Medicine & Rehabilitation

## 2016-01-26 ENCOUNTER — Encounter: Payer: Self-pay | Admitting: Physical Medicine & Rehabilitation

## 2016-01-26 VITALS — BP 134/78 | HR 63

## 2016-01-26 DIAGNOSIS — M5136 Other intervertebral disc degeneration, lumbar region: Secondary | ICD-10-CM

## 2016-01-26 DIAGNOSIS — G894 Chronic pain syndrome: Secondary | ICD-10-CM | POA: Diagnosis not present

## 2016-01-26 DIAGNOSIS — M5442 Lumbago with sciatica, left side: Secondary | ICD-10-CM

## 2016-01-26 DIAGNOSIS — S7012XS Contusion of left thigh, sequela: Secondary | ICD-10-CM | POA: Insufficient documentation

## 2016-01-26 DIAGNOSIS — Z5181 Encounter for therapeutic drug level monitoring: Secondary | ICD-10-CM | POA: Diagnosis not present

## 2016-01-26 DIAGNOSIS — S72362S Displaced segmental fracture of shaft of left femur, sequela: Secondary | ICD-10-CM | POA: Insufficient documentation

## 2016-01-26 DIAGNOSIS — M5416 Radiculopathy, lumbar region: Secondary | ICD-10-CM

## 2016-01-26 DIAGNOSIS — Z79899 Other long term (current) drug therapy: Secondary | ICD-10-CM | POA: Diagnosis not present

## 2016-01-26 DIAGNOSIS — D62 Acute posthemorrhagic anemia: Secondary | ICD-10-CM | POA: Insufficient documentation

## 2016-01-26 MED ORDER — FENTANYL 25 MCG/HR TD PT72: 25.0000 ug | MEDICATED_PATCH | TRANSDERMAL | Status: DC

## 2016-01-26 MED ORDER — TIZANIDINE HCL 2 MG PO TABS: ORAL_TABLET | ORAL | Status: DC

## 2016-01-26 MED ORDER — OXYCODONE HCL 10 MG PO TABS: 10.0000 mg | ORAL_TABLET | Freq: Four times a day (QID) | ORAL | Status: DC | PRN

## 2016-01-26 NOTE — Patient Instructions (Addendum)
PLEASE CALL ME WITH ANY PROBLEMS OR QUESTIONS (#[phone]).     [place]
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
## 2016-02-02 ENCOUNTER — Ambulatory Visit
Admission: RE | Admit: 2016-02-02 | Discharge: 2016-02-02 | Disposition: A | Discharge status: Home / Self Care | Payer: BLUE CROSS/BLUE SHIELD | Source: Ambulatory Visit | Attending: Physical Medicine & Rehabilitation | Admitting: Physical Medicine & Rehabilitation

## 2016-02-02 DIAGNOSIS — S72362S Displaced segmental fracture of shaft of left femur, sequela: Secondary | ICD-10-CM

## 2016-02-02 DIAGNOSIS — M5136 Other intervertebral disc degeneration, lumbar region: Secondary | ICD-10-CM

## 2016-02-02 DIAGNOSIS — M5416 Radiculopathy, lumbar region: Secondary | ICD-10-CM

## 2016-02-08 ENCOUNTER — Telehealth: Payer: Self-pay | Admitting: Physical Medicine & Rehabilitation

## 2016-02-08 NOTE — Telephone Encounter (Signed)
I reviewed his MRI:    Mildly progressive annular disc bulging eccentric to the right at L4-5 with resulting right foraminal narrowing and possible right L4 nerve root encroachment. At that level, there is borderline multifactorial spinal stenosis and mild narrowing of the lateral Recesses.  I called him and left VM to call us here. Would recommend right L4-5 trans LESI (was done at L5-S1 before) per Dr. Letta Pate if he is willing to pursue.   thx

## 2016-02-08 NOTE — Telephone Encounter (Signed)
Attempted to reach Mr Gregory Ibarra. Phone rang and picked up but no voicemail or answer from other end of phone.  Dr Naaman Plummer left message so we will wait for Mr Gregory Ibarra to call back.

## 2016-02-08 NOTE — Telephone Encounter (Signed)
Mr Abebe called back and he has spoken with Dr Naaman Plummer.

## 2016-02-16 ENCOUNTER — Encounter: Payer: Self-pay | Admitting: Physical Medicine & Rehabilitation

## 2016-02-16 ENCOUNTER — Ambulatory Visit (HOSPITAL_BASED_OUTPATIENT_CLINIC_OR_DEPARTMENT_OTHER): Payer: BLUE CROSS/BLUE SHIELD | Admitting: Physical Medicine & Rehabilitation

## 2016-02-16 VITALS — BP 128/66 | HR 76 | Resp 14

## 2016-02-16 DIAGNOSIS — M5416 Radiculopathy, lumbar region: Secondary | ICD-10-CM

## 2016-02-16 DIAGNOSIS — G894 Chronic pain syndrome: Secondary | ICD-10-CM | POA: Diagnosis not present

## 2016-02-16 NOTE — Progress Notes (Signed)
Right L4-L5 Lumbar transforaminal epidural steroid injection under fluoroscopic guidance  Indication: Lumbosacral radiculitis is not relieved by medication management or other conservative care and interfering with self-care and mobility.   Informed consent was obtained after describing risk and benefits of the procedure with the patient, this includes bleeding, bruising, infection, paralysis and medication side effects.  The patient wishes to proceed and has given written consent.  Patient was placed in prone position.  The lumbar area was marked and prepped with Betadine.  It was entered with a 25-gauge 1-1/2 inch needle and one mL of 1% lidocaine was injected into the skin and subcutaneous tissue.  Then a 22-gauge 3.5 inch spinal needle was inserted into the Right L4-L5 intervertebral foramen under AP, lateral, and oblique view.  Then a solution containing one mL of 10 mg per mL dexamethasone and 2 mL of 1% lidocaine was injected.  The patient tolerated procedure well.  Post procedure instructions were given.  Please see post procedure form.  Preinjection pain 8/10 Postinjection pain 0/10

## 2016-02-16 NOTE — Patient Instructions (Signed)

## 2016-02-16 NOTE — Progress Notes (Signed)
  PROCEDURE RECORD [facility]   Name: Chloe Baig DOB:December 07, 1975 MRN: [id number]  Date:02/16/2016  Physician: Alysia Penna, MD    Nurse/CMA: Mancel Parsons, CMA  Allergies:  Allergies  Allergen Reactions  . Sulfa Antibiotics Rash  . Sulfamethoxazole-Trimethoprim Rash    Bactrim    Consent Signed: Yes.    Is patient diabetic? No.  CBG today? N/A  Pregnant: No. LMP: No LMP for male patient. (age 40-55)  Anticoagulants: no Anti-inflammatory: no Antibiotics: no  Procedure: right transforaminal epidural steroid injection  Position: Prone Start Time:  1:14pm     End Time: 1:18pm  Fluoro Time: 17  RN/CMA Rolan Bucco Gabrille Kilbride    Time 1:00pm 1:21pm    BP 128/66 129/77    Pulse 76 68    Respirations 14 14    O2 Sat 97 99    S/S 6 6    Pain Level 8/10 0/10     D/C home with girlfriend patient A & O X 3, D/C instructions reviewed, and sits independently.

## 2016-02-23 ENCOUNTER — Encounter
Payer: BLUE CROSS/BLUE SHIELD | Attending: Physical Medicine & Rehabilitation | Admitting: Physical Medicine & Rehabilitation

## 2016-02-23 DIAGNOSIS — S72362S Displaced segmental fracture of shaft of left femur, sequela: Secondary | ICD-10-CM | POA: Insufficient documentation

## 2016-02-23 DIAGNOSIS — S7012XS Contusion of left thigh, sequela: Secondary | ICD-10-CM | POA: Insufficient documentation

## 2016-02-23 DIAGNOSIS — D62 Acute posthemorrhagic anemia: Secondary | ICD-10-CM | POA: Insufficient documentation

## 2016-02-23 DIAGNOSIS — Z5181 Encounter for therapeutic drug level monitoring: Secondary | ICD-10-CM | POA: Insufficient documentation

## 2016-02-23 DIAGNOSIS — G894 Chronic pain syndrome: Secondary | ICD-10-CM | POA: Insufficient documentation

## 2016-02-23 DIAGNOSIS — Z79899 Other long term (current) drug therapy: Secondary | ICD-10-CM | POA: Insufficient documentation

## 2016-02-24 ENCOUNTER — Encounter: Payer: Self-pay | Admitting: Physical Medicine & Rehabilitation

## 2016-02-24 ENCOUNTER — Encounter (HOSPITAL_BASED_OUTPATIENT_CLINIC_OR_DEPARTMENT_OTHER): Payer: BLUE CROSS/BLUE SHIELD | Admitting: Physical Medicine & Rehabilitation

## 2016-02-24 VITALS — BP 133/90 | HR 67 | Resp 14

## 2016-02-24 DIAGNOSIS — S72362S Displaced segmental fracture of shaft of left femur, sequela: Secondary | ICD-10-CM | POA: Diagnosis not present

## 2016-02-24 DIAGNOSIS — G894 Chronic pain syndrome: Secondary | ICD-10-CM

## 2016-02-24 DIAGNOSIS — M5442 Lumbago with sciatica, left side: Secondary | ICD-10-CM

## 2016-02-24 DIAGNOSIS — D62 Acute posthemorrhagic anemia: Secondary | ICD-10-CM | POA: Diagnosis not present

## 2016-02-24 DIAGNOSIS — Z79899 Other long term (current) drug therapy: Secondary | ICD-10-CM

## 2016-02-24 DIAGNOSIS — Z5181 Encounter for therapeutic drug level monitoring: Secondary | ICD-10-CM

## 2016-02-24 DIAGNOSIS — S7012XS Contusion of left thigh, sequela: Secondary | ICD-10-CM | POA: Diagnosis not present

## 2016-02-24 MED ORDER — OXYCODONE HCL 10 MG PO TABS: 10.0000 mg | ORAL_TABLET | Freq: Four times a day (QID) | ORAL | Status: DC | PRN

## 2016-02-24 MED ORDER — DIAZEPAM 5 MG PO TABS: 5.0000 mg | ORAL_TABLET | Freq: Two times a day (BID) | ORAL | Status: DC
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
## 2016-02-24 NOTE — Patient Instructions (Signed)
APPLY FENTANYL PATCH EVERY 4 DAYS UNTIL GONE. THEN STOP   PLEASE CALL ME WITH ANY PROBLEMS OR QUESTIONS (#[phone]).

## 2016-03-01 ENCOUNTER — Ambulatory Visit (HOSPITAL_COMMUNITY)
Admission: EM | Admit: 2016-03-01 | Discharge: 2016-03-01 | Disposition: A | Discharge status: Home / Self Care | Payer: BLUE CROSS/BLUE SHIELD | Attending: Family Medicine | Admitting: Family Medicine

## 2016-03-01 ENCOUNTER — Encounter (HOSPITAL_COMMUNITY): Payer: Self-pay | Admitting: Emergency Medicine

## 2016-03-01 DIAGNOSIS — I10 Essential (primary) hypertension: Secondary | ICD-10-CM

## 2016-03-01 NOTE — ED Provider Notes (Signed)
CSN: [id number]     Arrival date & time 03/01/16  1914 History   First MD Initiated Contact with Patient 03/01/16 2022     Chief Complaint  Patient presents with  . Hypertension   (Consider location/radiation/quality/duration/timing/severity/associated sxs/prior Treatment) Patient is a 40 y.o. male presenting with hypertension. The history is provided by the patient.  Hypertension This is a new problem. Episode onset: h/o hbp more than 1 yr ago , uncertain to med. The problem has not changed since onset.Pertinent negatives include no chest pain, no headaches and no shortness of breath.    Past Medical History  Diagnosis Date  . Asthma   . Migraine   . Spinal stenosis   . Scoliosis   . Seasonal allergies    Past Surgical History  Procedure Laterality Date  . Cosmetic surgery    . Femur fracture surgery Left 02/08/2015  . Femur im nail Left 02/08/2015    Procedure: INTRAMEDULLARY (IM) NAIL FEMORAL;  Surgeon: Leandrew Koyanagi, MD;  Location: [place];  Service: Orthopedics;  Laterality: Left;  . Incision and drainage of wound Right 02/08/2015    Procedure: IRRIGATION AND DEBRIDEMENT OF RIGHT LOWER LEG WOUND;  Surgeon: Leandrew Koyanagi, MD;  Location: [place];  Service: Orthopedics;  Laterality: Right;   Family History  Problem Relation Age of Onset  . Hypertension Mother   . Asthma Mother    Social History  Substance Use Topics  . Smoking status: Current Every Day Smoker -- 1.00 packs/day    Types: Cigarettes  . Smokeless tobacco: Never Used  . Alcohol Use: No     Comment: occasional    Review of Systems  Respiratory: Negative.  Negative for shortness of breath.   Cardiovascular: Negative.  Negative for chest pain.  Neurological: Negative for headaches.  All other systems reviewed and are negative.   Allergies  Sulfa antibiotics and Sulfamethoxazole-trimethoprim  Home Medications   Prior to Admission medications   Medication Sig Start Date End Date Taking? Authorizing Provider   acetaminophen (TYLENOL) 500 MG tablet Take 500-1,000 mg by mouth every 6 (six) hours as needed for pain.   Yes Historical Provider, MD  diazepam (VALIUM) 5 MG tablet Take 1 tablet (5 mg total) by mouth 2 (two) times daily. 02/24/16  Yes Meredith Staggers, MD  fentaNYL (DURAGESIC - DOSED MCG/HR) 25 MCG/HR patch Place 25 mcg onto the skin every 3 (three) days.   Yes Historical Provider, MD  Oxycodone HCl 10 MG TABS Take 1 tablet (10 mg total) by mouth every 6 (six) hours as needed (pain). 02/24/16  Yes Meredith Staggers, MD  tiZANidine (ZANAFLEX) 2 MG tablet TAKE ONE  TABLET  BY MOUTH THREE X DAILY 01/26/16  Yes Meredith Staggers, MD  DOCQLACE 100 MG capsule TK ONE C PO  BID 02/18/15   Historical Provider, MD  fluticasone (FLONASE) 50 MCG/ACT nasal spray Place 2 sprays into both nostrils daily. 05/27/15   Lance Bosch, NP  pantoprazole (PROTONIX) 40 MG tablet Take 1 tablet (40 mg total) by mouth daily. 02/18/15   Ivan Anchors Love, PA-C  polyethylene glycol powder (GLYCOLAX/MIRALAX) powder Take 17 g by mouth daily. 05/27/15   Lance Bosch, NP  senna (SENOKOT) 8.6 MG TABS tablet Take 1 tablet (8.6 mg total) by mouth at bedtime as needed for mild constipation. 05/27/15   Lance Bosch, NP   Meds Ordered and Administered this Visit  Medications - No data to display  BP 142/92 mmHg  Pulse 77  Temp(Src) 98.2 F (36.8 C) (Oral)  SpO2 97% No data found.   Physical Exam  Constitutional: He is oriented to person, place, and time. He appears well-developed and well-nourished. No distress.  Cardiovascular: Normal rate, regular rhythm, normal heart sounds and intact distal pulses.   Pulmonary/Chest: Effort normal and breath sounds normal.  Musculoskeletal: He exhibits no edema.  Neurological: He is alert and oriented to person, place, and time.  Skin: Skin is warm and dry.  Nursing note and vitals reviewed.   ED Course  Procedures (including critical care time)  Labs Review Labs Reviewed - No data to  display  Imaging Review No results found.   Visual Acuity Review  Right Eye Distance:   Left Eye Distance:   Bilateral Distance:    Right Eye Near:   Left Eye Near:    Bilateral Near:         MDM   1. Essential hypertension, benign        Billy Fischer, MD 03/01/16 2045

## 2016-03-01 NOTE — Discharge Instructions (Signed)
See your doctor to monitor your blood pressure to determine need for medicine

## 2016-03-01 NOTE — ED Notes (Signed)
The patient presented to the [facility] with a complaint of HTN. The patient stated that he has previously been treated for HTN after a surgery but he hasn't had any medicine in over 1 year and could not remember what it was he was taking.

## 2016-03-04 ENCOUNTER — Emergency Department (HOSPITAL_COMMUNITY)
Admission: EM | Admit: 2016-03-04 | Discharge: 2016-03-04 | Disposition: A | Discharge status: Home / Self Care | Payer: BLUE CROSS/BLUE SHIELD | Attending: Emergency Medicine | Admitting: Emergency Medicine

## 2016-03-04 ENCOUNTER — Encounter (HOSPITAL_COMMUNITY): Payer: Self-pay

## 2016-03-04 ENCOUNTER — Emergency Department (HOSPITAL_COMMUNITY): Payer: BLUE CROSS/BLUE SHIELD

## 2016-03-04 DIAGNOSIS — K5900 Constipation, unspecified: Secondary | ICD-10-CM | POA: Diagnosis not present

## 2016-03-04 DIAGNOSIS — R112 Nausea with vomiting, unspecified: Secondary | ICD-10-CM | POA: Insufficient documentation

## 2016-03-04 DIAGNOSIS — Z8679 Personal history of other diseases of the circulatory system: Secondary | ICD-10-CM | POA: Insufficient documentation

## 2016-03-04 DIAGNOSIS — Z7951 Long term (current) use of inhaled steroids: Secondary | ICD-10-CM | POA: Diagnosis not present

## 2016-03-04 DIAGNOSIS — J45909 Unspecified asthma, uncomplicated: Secondary | ICD-10-CM | POA: Insufficient documentation

## 2016-03-04 DIAGNOSIS — M419 Scoliosis, unspecified: Secondary | ICD-10-CM | POA: Insufficient documentation

## 2016-03-04 DIAGNOSIS — R1013 Epigastric pain: Secondary | ICD-10-CM | POA: Diagnosis present

## 2016-03-04 DIAGNOSIS — F17210 Nicotine dependence, cigarettes, uncomplicated: Secondary | ICD-10-CM | POA: Insufficient documentation

## 2016-03-04 DIAGNOSIS — Z79899 Other long term (current) drug therapy: Secondary | ICD-10-CM | POA: Diagnosis not present

## 2016-03-04 LAB — URINALYSIS, ROUTINE W REFLEX MICROSCOPIC
BILIRUBIN URINE: NEGATIVE
GLUCOSE, UA: NEGATIVE mg/dL
HGB URINE DIPSTICK: NEGATIVE
Ketones, ur: NEGATIVE mg/dL
Leukocytes, UA: NEGATIVE
Nitrite: NEGATIVE
PH: 6.5 (ref 5.0–8.0)
Protein, ur: NEGATIVE mg/dL
SPECIFIC GRAVITY, URINE: 1.019 (ref 1.005–1.030)

## 2016-03-04 LAB — COMPREHENSIVE METABOLIC PANEL
ALBUMIN: 4.7 g/dL (ref 3.5–5.0)
ALK PHOS: 73 U/L (ref 38–126)
ALT: 21 U/L (ref 17–63)
ANION GAP: 4 — AB (ref 5–15)
AST: 22 U/L (ref 15–41)
BUN: 8 mg/dL (ref 6–20)
CALCIUM: 9.4 mg/dL (ref 8.9–10.3)
CO2: 25 mmol/L (ref 22–32)
Chloride: 110 mmol/L (ref 101–111)
Creatinine, Ser: 0.87 mg/dL (ref 0.61–1.24)
GFR calc Af Amer: >60 mL/min (ref >60)
GFR calc non Af Amer: >60 mL/min (ref >60)
GLUCOSE: 103 mg/dL — AB (ref 65–99)
POTASSIUM: 3.9 mmol/L (ref 3.5–5.1)
SODIUM: 139 mmol/L (ref 135–145)
Total Bilirubin: 1.1 mg/dL (ref 0.3–1.2)
Total Protein: 7.3 g/dL (ref 6.5–8.1)

## 2016-03-04 LAB — LIPASE, BLOOD: Lipase: 20 U/L (ref 11–51)

## 2016-03-04 LAB — CBC
HEMATOCRIT: 42.9 % (ref 39.0–52.0)
HEMOGLOBIN: 15.5 g/dL (ref 13.0–17.0)
MCH: 30.3 pg (ref 26.0–34.0)
MCHC: 36.1 g/dL — AB (ref 30.0–36.0)
MCV: 83.8 fL (ref 78.0–100.0)
Platelets: 223 10*3/uL (ref 150–400)
RBC: 5.12 MIL/uL (ref 4.22–5.81)
RDW: 13.6 % (ref 11.5–15.5)
WBC: 8.3 10*3/uL (ref 4.0–10.5)

## 2016-03-04 MED ORDER — SODIUM CHLORIDE 0.9 % IV BOLUS (SEPSIS)
1000.0000 mL | Freq: Once | INTRAVENOUS | Status: AC
  Administered 2016-03-04: 1000 mL via INTRAVENOUS

## 2016-03-04 MED ORDER — METOCLOPRAMIDE HCL 5 MG/ML IJ SOLN
10.0000 mg | Freq: Once | INTRAMUSCULAR | Status: AC
  Administered 2016-03-04: 10 mg via INTRAVENOUS
  Filled 2016-03-04: qty 2

## 2016-03-04 MED ORDER — PANTOPRAZOLE SODIUM 40 MG IV SOLR
40.0000 mg | Freq: Once | INTRAVENOUS | Status: AC
Start: 2016-03-04 — End: 2016-03-04
  Administered 2016-03-04: 40 mg via INTRAVENOUS
  Filled 2016-03-04: qty 40

## 2016-03-04 MED ORDER — ONDANSETRON HCL 4 MG PO TABS: 4.0000 mg | ORAL_TABLET | Freq: Four times a day (QID) | ORAL | Status: AC

## 2016-03-04 MED ORDER — PANTOPRAZOLE SODIUM 40 MG PO TBEC: 40.0000 mg | DELAYED_RELEASE_TABLET | Freq: Every day | ORAL | Status: AC

## 2016-03-04 NOTE — ED Notes (Signed)
Per EMS, Pt, from home, c/o LUQ abdominal pain and emesis starting this morning and chronic constipation.  Pain score 6/10.  Pt was given 14m Zofran and 1033m Fentanyl en route.

## 2016-03-04 NOTE — Discharge Instructions (Signed)

## 2016-03-04 NOTE — ED Provider Notes (Signed)
CSN: [id number]     Arrival date & time 03/04/16  1700 History   First MD Initiated Contact with Patient 03/04/16 1133     Chief Complaint  Patient presents with  . Abdominal Pain  . Emesis  . Constipation   HPI   Gregory Ibarra is a 40 y.o. male PMH significant for asthma, migraine, spinal stenosis, scoliosis presenting with epigastric abdominal pain and emesis since this morning. Prior to arrival, patient was given 4 mg Zofran and 100 g of fentanyl. Upon evaluation, patient's pain was 6 out of 10 pain scale, nonradiating, constant, achy. In regards to his emesis, and has been nonbilious, nonbloody; however after vomiting multiple times he did have one episode of streaky bright red blood emesis. He denies fevers, chills, chest pain, alcohol use, shortness of breath, changes in bowel or bladder habits (he endorses chronic constipation as he is on pain medication chronically). He does endorse taking naproxen for approximately 2 months for hand pain.  Past Medical History  Diagnosis Date  . Asthma   . Migraine   . Spinal stenosis   . Scoliosis   . Seasonal allergies    Past Surgical History  Procedure Laterality Date  . Cosmetic surgery    . Femur fracture surgery Left 02/08/2015  . Femur im nail Left 02/08/2015    Procedure: INTRAMEDULLARY (IM) NAIL FEMORAL;  Surgeon: Leandrew Koyanagi, MD;  Location: [place];  Service: Orthopedics;  Laterality: Left;  . Incision and drainage of wound Right 02/08/2015    Procedure: IRRIGATION AND DEBRIDEMENT OF RIGHT LOWER LEG WOUND;  Surgeon: Leandrew Koyanagi, MD;  Location: [place];  Service: Orthopedics;  Laterality: Right;   Family History  Problem Relation Age of Onset  . Hypertension Mother   . Asthma Mother    Social History  Substance Use Topics  . Smoking status: Current Every Day Smoker -- 1.00 packs/day    Types: Cigarettes  . Smokeless tobacco: Never Used  . Alcohol Use: No     Comment: occasional    Review of Systems  Ten systems are  reviewed and are negative for acute change except as noted in the HPI  Allergies  Sulfa antibiotics and Sulfamethoxazole-trimethoprim  Home Medications   Prior to Admission medications   Medication Sig Start Date End Date Taking? Authorizing Provider  diazepam (VALIUM) 5 MG tablet Take 1 tablet (5 mg total) by mouth 2 (two) times daily. 02/24/16  Yes Meredith Staggers, MD  fluticasone (FLONASE) 50 MCG/ACT nasal spray Place 2 sprays into both nostrils daily. 05/27/15  Yes Lance Bosch, NP  GARLIC PO Take 1 tablet by mouth daily.   Yes Historical Provider, MD  Multiple Vitamin (MULTIVITAMIN WITH MINERALS) TABS tablet Take 1 tablet by mouth daily.   Yes Historical Provider, MD  Oxycodone HCl 10 MG TABS Take 1 tablet (10 mg total) by mouth every 6 (six) hours as needed (pain). 02/24/16  Yes Meredith Staggers, MD  polyethylene glycol powder (GLYCOLAX/MIRALAX) powder Take 17 g by mouth daily. Patient taking differently: Take 17 g by mouth daily as needed for mild constipation.  05/27/15  Yes Lance Bosch, NP  senna (SENOKOT) 8.6 MG TABS tablet Take 1 tablet (8.6 mg total) by mouth at bedtime as needed for mild constipation. 05/27/15  Yes Lance Bosch, NP  tiZANidine (ZANAFLEX) 2 MG tablet TAKE ONE  TABLET  BY MOUTH THREE X DAILY 01/26/16  Yes Meredith Staggers, MD  pantoprazole (PROTONIX) 40 MG tablet  Take 1 tablet (40 mg total) by mouth daily. Patient not taking: Reported on 03/04/2016 02/18/15   Ivan Anchors Love, PA-C   BP 134/87 mmHg  Pulse 60  Temp(Src) 97.7 F (36.5 C) (Oral)  Resp 16  SpO2 99% Physical Exam  Constitutional: He appears well-developed and well-nourished. No distress.  HENT:  Head: Normocephalic and atraumatic.  Mouth/Throat: Oropharynx is clear and moist. No oropharyngeal exudate.  Eyes: Conjunctivae are normal. Pupils are equal, round, and reactive to light. Right eye exhibits no discharge. Left eye exhibits no discharge. No scleral icterus.  Neck: No tracheal deviation present.   Cardiovascular: Normal rate, regular rhythm, normal heart sounds and intact distal pulses.  Exam reveals no gallop and no friction rub.   No murmur heard. Pulmonary/Chest: Effort normal and breath sounds normal. No respiratory distress. He has no wheezes. He has no rales. He exhibits no tenderness.  Abdominal: Soft. Bowel sounds are normal. He exhibits no distension and no mass. There is tenderness. There is no rebound and no guarding.  Mild, distractible epigastric tenderness.  Musculoskeletal: He exhibits no edema.  Lymphadenopathy:    He has no cervical adenopathy.  Neurological: He is alert. Coordination normal.  Skin: Skin is warm and dry. No rash noted. He is not diaphoretic. No erythema.  Psychiatric: He has a normal mood and affect. His behavior is normal.  Nursing note and vitals reviewed.   ED Course  Procedures Labs Review Labs Reviewed  COMPREHENSIVE METABOLIC PANEL - Abnormal; Notable for the following:    Glucose, Bld 103 (*)    Anion gap 4 (*)    All other components within normal limits  CBC - Abnormal; Notable for the following:    MCHC 36.1 (*)    All other components within normal limits  LIPASE, BLOOD  URINALYSIS, ROUTINE W REFLEX MICROSCOPIC (NOT AT [facility])   MDM   Final diagnoses:  Epigastric abdominal pain  Nausea and vomiting, vomiting of unspecified type   Patient nontoxic-appearing, vital signs stable. Based on patient history and physical exam, most likely etiologies are GERD versus gastritis/PUD. Less likely etiologies include pancreatitis, MI. Patient feels improved after Reglan, Protonix, IV fluid bolus. Patient is requesting to leave as he needs to go pick his children up. Discussed return precautions. Patient to follow-up with primary care provider within one week. Encouraged patient to stop taking naproxen as this may worsen PUD. Patient may be safely discharged at this time. Patient is in understanding and agreement with the plan.  Orient Lions, PA-C 03/04/16 [place] Liu, MD 03/05/16 [phone]

## 2016-03-18 ENCOUNTER — Other Ambulatory Visit: Payer: Self-pay | Admitting: Registered Nurse

## 2016-03-21 NOTE — Telephone Encounter (Signed)
Reordered due to original order on 02/24/16 by Dr Naaman Plummer was on "no print" and did not get called in

## 2016-03-24 ENCOUNTER — Encounter: Payer: Self-pay | Admitting: Physical Medicine & Rehabilitation

## 2016-03-24 ENCOUNTER — Ambulatory Visit (HOSPITAL_BASED_OUTPATIENT_CLINIC_OR_DEPARTMENT_OTHER): Payer: BLUE CROSS/BLUE SHIELD | Admitting: Physical Medicine & Rehabilitation

## 2016-03-24 ENCOUNTER — Encounter: Payer: BLUE CROSS/BLUE SHIELD | Attending: Physical Medicine & Rehabilitation

## 2016-03-24 VITALS — BP 123/74 | HR 60 | Resp 14

## 2016-03-24 DIAGNOSIS — D62 Acute posthemorrhagic anemia: Secondary | ICD-10-CM | POA: Diagnosis not present

## 2016-03-24 DIAGNOSIS — M5416 Radiculopathy, lumbar region: Secondary | ICD-10-CM

## 2016-03-24 DIAGNOSIS — G894 Chronic pain syndrome: Secondary | ICD-10-CM | POA: Insufficient documentation

## 2016-03-24 DIAGNOSIS — S7012XS Contusion of left thigh, sequela: Secondary | ICD-10-CM | POA: Diagnosis not present

## 2016-03-24 DIAGNOSIS — Z79899 Other long term (current) drug therapy: Secondary | ICD-10-CM | POA: Diagnosis not present

## 2016-03-24 DIAGNOSIS — S72362S Displaced segmental fracture of shaft of left femur, sequela: Secondary | ICD-10-CM | POA: Diagnosis not present

## 2016-03-24 DIAGNOSIS — Z5181 Encounter for therapeutic drug level monitoring: Secondary | ICD-10-CM | POA: Diagnosis not present

## 2016-03-24 NOTE — Progress Notes (Signed)
Lumbar epidural steroid injection under fluoroscopic guidance  Indication: Lumbosacral radiculitis is not relieved by medication management or other conservative care and interfering with self-care and mobility.  No  anticoagulant use.  Informed consent was obtained after describing risk and benefits of the procedure with the patient, this includes bleeding, bruising, infection, paralysis and medication side effects.  The patient wishes to proceed and has given written consent.  Patient was placed in a prone position.  The lumbar area was marked and prepped with Betadine.  It was entered with a 25-gauge 1-1/2 inch needle and one mL of 1% lidocaine was injected into the skin and subcutaneous tissue.  Then a 17-gauge spinal needle was inserted under fluoroscopic guidance into the Right L4-5 interlaminar space under AP and Lateral imaging.  Once needle tip of approximated the posterior elements, a loss of resistance technique was utilized with lateral imaging.  A positive loss of resistance was obtained and then confirmed by injecting 2 mL's of Omnipaque 180.  Then a solution containing 1.5 mL's of 36m/ml Celestone and 1.5 mL's of 1% lidocaine was injected.  The patient tolerated procedure well.  Post procedure instructions were given.  Please see post procedure form.

## 2016-03-24 NOTE — Progress Notes (Signed)
  PROCEDURE RECORD [facility]   Name: Alik Mawson DOB:1976/06/23 MRN: [id number]  Date:03/24/2016  Physician: Alysia Penna, MD    Nurse/CMA: Gara Kroner  Allergies:  Allergies  Allergen Reactions  . Sulfa Antibiotics Rash  . Sulfamethoxazole-Trimethoprim Rash    Bactrim    Consent Signed: Yes.    Is patient diabetic? No.  CBG today? NA  Pregnant: No. LMP: No LMP for male patient. (age 40-55)  Anticoagulants: no Anti-inflammatory: no Antibiotics: no  Procedure: Translaminar Epidural Steroid Injection Position: Prone Start Time: 1315 End Time: 1319 Fluoro Time: 18  RN/CMA Yussef Jorge, CMA [employer], CMA    Time [id number]    BP 123/74 127/75    Pulse 60 77    Respirations 14 14    O2 Sat 9 97    S/S 6 6    Pain Level 7/10 8/10     D/C home with friend, patient A & O X 3, D/C instructions reviewed, and sits independently.

## 2016-03-24 NOTE — Patient Instructions (Signed)

## 2016-04-12 ENCOUNTER — Encounter: Payer: Self-pay | Admitting: Registered Nurse

## 2016-04-12 ENCOUNTER — Encounter (HOSPITAL_BASED_OUTPATIENT_CLINIC_OR_DEPARTMENT_OTHER): Payer: BLUE CROSS/BLUE SHIELD | Admitting: Registered Nurse

## 2016-04-12 VITALS — BP 138/89 | HR 75 | Resp 15

## 2016-04-12 DIAGNOSIS — G894 Chronic pain syndrome: Secondary | ICD-10-CM

## 2016-04-12 DIAGNOSIS — M5416 Radiculopathy, lumbar region: Secondary | ICD-10-CM

## 2016-04-12 DIAGNOSIS — Z5181 Encounter for therapeutic drug level monitoring: Secondary | ICD-10-CM | POA: Diagnosis not present

## 2016-04-12 DIAGNOSIS — R296 Repeated falls: Secondary | ICD-10-CM

## 2016-04-12 DIAGNOSIS — Z79899 Other long term (current) drug therapy: Secondary | ICD-10-CM

## 2016-04-12 DIAGNOSIS — S72362S Displaced segmental fracture of shaft of left femur, sequela: Secondary | ICD-10-CM

## 2016-04-12 DIAGNOSIS — M659 Synovitis and tenosynovitis, unspecified: Secondary | ICD-10-CM

## 2016-04-12 DIAGNOSIS — M6588 Other synovitis and tenosynovitis, other site: Secondary | ICD-10-CM

## 2016-04-12 DIAGNOSIS — M5136 Other intervertebral disc degeneration, lumbar region: Secondary | ICD-10-CM

## 2016-04-12 DIAGNOSIS — R2689 Other abnormalities of gait and mobility: Secondary | ICD-10-CM

## 2016-04-12 DIAGNOSIS — M542 Cervicalgia: Secondary | ICD-10-CM

## 2016-04-12 MED ORDER — TIZANIDINE HCL 2 MG PO TABS: ORAL_TABLET | ORAL | Status: AC

## 2016-04-12 MED ORDER — METHYLPREDNISOLONE 4 MG PO TBPK: ORAL_TABLET | ORAL | Status: DC

## 2016-04-12 MED ORDER — OXYCODONE HCL 10 MG PO TABS: 10.0000 mg | ORAL_TABLET | Freq: Four times a day (QID) | ORAL | Status: DC | PRN
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
## 2016-04-25 ENCOUNTER — Encounter: Payer: Self-pay | Admitting: Diagnostic Neuroimaging

## 2016-04-25 ENCOUNTER — Ambulatory Visit (INDEPENDENT_AMBULATORY_CARE_PROVIDER_SITE_OTHER): Payer: BLUE CROSS/BLUE SHIELD | Admitting: Diagnostic Neuroimaging

## 2016-04-25 VITALS — BP 123/85 | HR 59 | Ht 70.5 in | Wt 195.6 lb

## 2016-04-25 DIAGNOSIS — M5441 Lumbago with sciatica, right side: Secondary | ICD-10-CM | POA: Diagnosis not present

## 2016-04-25 DIAGNOSIS — S72362S Displaced segmental fracture of shaft of left femur, sequela: Secondary | ICD-10-CM | POA: Diagnosis not present

## 2016-04-25 DIAGNOSIS — R269 Unspecified abnormalities of gait and mobility: Secondary | ICD-10-CM

## 2016-04-25 DIAGNOSIS — M5442 Lumbago with sciatica, left side: Secondary | ICD-10-CM | POA: Diagnosis not present
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
## 2016-04-25 NOTE — Patient Instructions (Signed)
Thank you for coming to see [place] at [facility]. I hope we have been able to provide you high quality care today.  You may receive a patient satisfaction survey over the next few weeks. We would appreciate your feedback and comments so that we may continue to improve ourselves and the health of our patients.  - monitor symptoms - return as needed   ~~~~~~~~~~~~~~~~~~~~~~~~~~~~~~~~~~~~~~~~~~~~~~~~~~~~~~~~~~~~~~~~~  DR. Nehal Witting'S GUIDE TO HAPPY AND HEALTHY LIVING These are some of my general health and wellness recommendations. Some of them may apply to you better than others. Please use common sense as you try these suggestions and feel free to ask me any questions.   ACTIVITY/FITNESS Mental, social, emotional and physical stimulation are very important for brain and body health. Try learning a new activity (arts, music, language, sports, games).  Keep moving your body to the best of your abilities. You can do this at home, inside or outside, the park, community center, gym or anywhere you like. Consider a physical therapist or personal trainer to get started. Consider the app Sworkit. Fitness trackers such as smart-watches, smart-phones or Fitbits can help as well.   NUTRITION Eat more plants: colorful vegetables, nuts, seeds and berries.  Eat less sugar, salt, preservatives and processed foods.  Avoid toxins such as cigarettes and alcohol.  Drink water when you are thirsty. Warm water with a slice of lemon is an excellent morning drink to start the day.  Consider these websites for more information The Nutrition Source (https://www.henry-hernandez.biz/) Precision Nutrition (WindowBlog.ch)   RELAXATION Consider practicing mindfulness meditation or other relaxation techniques such as deep breathing, prayer, yoga, tai chi, massage. See website mindful.org or the apps Headspace or Calm to help get started.   SLEEP Try  to get at least 7-8+ hours sleep per day. Regular exercise and reduced caffeine will help you sleep better. Practice good sleep hygeine techniques. See website sleep.org for more information.   PLANNING Prepare estate planning, living will, healthcare POA documents. Sometimes this is best planned with the help of an attorney. Theconversationproject.org and agingwithdignity.org are excellent resources.

## 2016-05-03 ENCOUNTER — Encounter: Payer: Self-pay | Admitting: Registered Nurse

## 2016-05-03 ENCOUNTER — Encounter (HOSPITAL_COMMUNITY): Payer: Self-pay

## 2016-05-03 ENCOUNTER — Emergency Department (HOSPITAL_COMMUNITY)
Admission: EM | Admit: 2016-05-03 | Discharge: 2016-05-03 | Disposition: A | Discharge status: Home / Self Care | Payer: BLUE CROSS/BLUE SHIELD | Attending: Emergency Medicine | Admitting: Emergency Medicine

## 2016-05-03 DIAGNOSIS — Z79899 Other long term (current) drug therapy: Secondary | ICD-10-CM | POA: Diagnosis not present

## 2016-05-03 DIAGNOSIS — F17210 Nicotine dependence, cigarettes, uncomplicated: Secondary | ICD-10-CM | POA: Diagnosis not present

## 2016-05-03 DIAGNOSIS — M545 Low back pain: Secondary | ICD-10-CM | POA: Insufficient documentation

## 2016-05-03 DIAGNOSIS — M542 Cervicalgia: Secondary | ICD-10-CM

## 2016-05-03 DIAGNOSIS — J45909 Unspecified asthma, uncomplicated: Secondary | ICD-10-CM | POA: Diagnosis not present

## 2016-05-03 DIAGNOSIS — G8929 Other chronic pain: Secondary | ICD-10-CM

## 2016-05-03 DIAGNOSIS — M549 Dorsalgia, unspecified: Secondary | ICD-10-CM | POA: Diagnosis present

## 2016-05-03 MED ORDER — METHYLPREDNISOLONE 4 MG PO TBPK: ORAL_TABLET | ORAL | Status: AC

## 2016-05-03 NOTE — ED Notes (Signed)
Pt verbalized understanding of d/c instructions and has no further questions. Pt stable and NAD. Pt ambulatory.

## 2016-05-03 NOTE — Discharge Instructions (Signed)
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
## 2016-05-04 ENCOUNTER — Telehealth: Payer: Self-pay | Admitting: Registered Nurse

## 2016-05-04 ENCOUNTER — Ambulatory Visit
Admission: RE | Admit: 2016-05-04 | Discharge: 2016-05-04 | Disposition: A | Discharge status: Home / Self Care | Payer: BLUE CROSS/BLUE SHIELD | Source: Ambulatory Visit | Attending: Registered Nurse | Admitting: Registered Nurse

## 2016-05-04 DIAGNOSIS — M5136 Other intervertebral disc degeneration, lumbar region: Secondary | ICD-10-CM

## 2016-05-04 DIAGNOSIS — M5126 Other intervertebral disc displacement, lumbar region: Secondary | ICD-10-CM

## 2016-05-04 DIAGNOSIS — M545 Low back pain: Secondary | ICD-10-CM

## 2016-05-04 DIAGNOSIS — G8929 Other chronic pain: Secondary | ICD-10-CM

## 2016-05-04 DIAGNOSIS — M5412 Radiculopathy, cervical region: Secondary | ICD-10-CM

## 2016-05-04 MED ORDER — CARBAMAZEPINE 200 MG PO TABS: ORAL_TABLET | ORAL | Status: AC

## 2016-05-04 NOTE — Telephone Encounter (Signed)
Spoke with Gregory Ibarra,  he states "he has noticed increased intensity of low back pain radiating into his lower extremities laterally. On 01/18/2016 he had left Transforamnial ESI. He had MRI on 02/02/16. Based on his MRI results, Dr. Naaman Plummer recommended Right L4-5   Transforaminal ESI. He had the  procedures on 02/16/2016 and 03/24/2016 by Dr. Letta Pate. [name] states he had no relief with the above procedures.  Also complaining of neck pain radiating into his right shoulder, bilateral arms and has left hand numbness. Will order a Cervical X-ray today, he verbalizes understanding. [name] also states he had diarrhea with gabapentin in the past and it was discontinued on 12/23/2015.  He went to [facility] ED on 05/03/16 for acute exacerbation of chronic low back pain, he was prescribed Medrol dose pak. The above will be discussed with Dr. Naaman Plummer and I will give him a return call. He verbalizes understanding.

## 2016-05-04 NOTE — Telephone Encounter (Signed)
Spoke with Dr. Naaman Plummer regarding Gregory Ibarra. Referral will be placed to Dr. Lindell Noe. We will prescribe Tegretol 200 mg one tablet at bedtime for one week. On June 21,2017 Tegretol will be increased to 200 mg one tablet twice a day. I called Gregory Ibarra regarding the above, and reviewed his X-ray results. He verbalizes understanding.

## 2016-05-13 ENCOUNTER — Encounter: Payer: BLUE CROSS/BLUE SHIELD | Attending: Physical Medicine & Rehabilitation | Admitting: Registered Nurse

## 2016-05-13 ENCOUNTER — Encounter: Payer: Self-pay | Admitting: Registered Nurse

## 2016-05-13 VITALS — BP 124/84 | HR 66

## 2016-05-13 DIAGNOSIS — Z79899 Other long term (current) drug therapy: Secondary | ICD-10-CM | POA: Diagnosis not present

## 2016-05-13 DIAGNOSIS — M5412 Radiculopathy, cervical region: Secondary | ICD-10-CM | POA: Diagnosis not present

## 2016-05-13 DIAGNOSIS — Z5181 Encounter for therapeutic drug level monitoring: Secondary | ICD-10-CM

## 2016-05-13 DIAGNOSIS — M5416 Radiculopathy, lumbar region: Secondary | ICD-10-CM

## 2016-05-13 DIAGNOSIS — S72362S Displaced segmental fracture of shaft of left femur, sequela: Secondary | ICD-10-CM | POA: Diagnosis not present

## 2016-05-13 DIAGNOSIS — S7012XS Contusion of left thigh, sequela: Secondary | ICD-10-CM | POA: Diagnosis not present

## 2016-05-13 DIAGNOSIS — D62 Acute posthemorrhagic anemia: Secondary | ICD-10-CM | POA: Insufficient documentation

## 2016-05-13 DIAGNOSIS — G894 Chronic pain syndrome: Secondary | ICD-10-CM

## 2016-05-13 MED ORDER — OXYCODONE HCL 10 MG PO TABS: 10.0000 mg | ORAL_TABLET | Freq: Four times a day (QID) | ORAL | Status: DC | PRN
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
## 2016-05-13 NOTE — Patient Instructions (Signed)
Referral was placed on 05/05/2016 to Dr. [facility]: [phone]

## 2016-05-16 ENCOUNTER — Encounter: Payer: Self-pay | Admitting: Diagnostic Neuroimaging

## 2016-05-16 ENCOUNTER — Ambulatory Visit: Payer: BLUE CROSS/BLUE SHIELD | Admitting: Diagnostic Neuroimaging

## 2016-05-23 LAB — TOXASSURE SELECT,+ANTIDEPR,UR: PDF: 0

## 2016-05-26 ENCOUNTER — Telehealth: Payer: Self-pay | Admitting: Physical Medicine & Rehabilitation

## 2016-05-26 NOTE — Telephone Encounter (Signed)
Patient called about referral that was made to [facility].  I told patient I would look into it for him.  Called there office and they said that he was a patient in their [place] office and had an outstanding balance.  Until he gets that cleared up, they could not make him an appointment here in [place].  The number to [facility] office is [phone] and he was told to speak to someone in billing.

## 2016-06-03 ENCOUNTER — Telehealth: Payer: Self-pay | Admitting: *Deleted

## 2016-06-03 DIAGNOSIS — G894 Chronic pain syndrome: Secondary | ICD-10-CM

## 2016-06-03 DIAGNOSIS — Z5181 Encounter for therapeutic drug level monitoring: Secondary | ICD-10-CM

## 2016-06-03 DIAGNOSIS — S72362S Displaced segmental fracture of shaft of left femur, sequela: Secondary | ICD-10-CM

## 2016-06-03 DIAGNOSIS — Z79899 Other long term (current) drug therapy: Secondary | ICD-10-CM

## 2016-06-03 NOTE — Progress Notes (Signed)
Urine drug screen for this encounter is inconsistent .  He is positive for alcohol and un prescribed ritalin metabolites.

## 2016-06-03 NOTE — Telephone Encounter (Signed)
Urine drug screen for this encounter is inconsistent for 6/23/117 encounter. Positive for alcohol and unprescribed ritalin metabolites.

## 2016-06-06 NOTE — Telephone Encounter (Signed)
Discharge from clinic for etoh (Very high) and ritalin---pt has not brought in bottles/pills etc as well.   Can give him options for other local pain clinics.

## 2016-06-07 MED ORDER — OXYCODONE HCL 10 MG PO TABS: 10.0000 mg | ORAL_TABLET | Freq: Four times a day (QID) | ORAL | Status: AC | PRN

## 2016-06-07 MED ORDER — OXYCODONE HCL 10 MG PO TABS: 10.0000 mg | ORAL_TABLET | Freq: Four times a day (QID) | ORAL | Status: DC | PRN

## 2016-06-07 NOTE — Telephone Encounter (Signed)
Mr Mapps notified of discharge.  A letter will be sent via my Chart and written one will be placed with final RX which can be picked up at the front desk. His appt 06/14/16 will be canceled.

## 2016-06-08 ENCOUNTER — Telehealth: Payer: Self-pay | Admitting: Physical Medicine & Rehabilitation

## 2016-06-08 NOTE — Telephone Encounter (Signed)
Patient called, notified that a script was available for him to pick up at our clinic. He acknowledged, will come to our clinic to p/u script

## 2016-06-08 NOTE — Telephone Encounter (Signed)
Patient has some questions about is UDS and being dismissed from the clinic Also - a lady called him yesterday to say she got his script for oxy and she was going to shred it - but that he may have to call us and ask for another script He has questions about this as well

## 2016-06-14 ENCOUNTER — Ambulatory Visit: Payer: BLUE CROSS/BLUE SHIELD | Admitting: Registered Nurse
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
